# Patient Record
Sex: Female | Born: 1990 | Race: White | Hispanic: No | Marital: Married | Smoking: Never smoker
Health system: Southern US, Community
[De-identification: ages and names within clinical notes are randomized; demographics above are authoritative.]

## PROBLEM LIST (undated history)

## (undated) DIAGNOSIS — D649 Anemia, unspecified: Secondary | ICD-10-CM

## (undated) DIAGNOSIS — F32A Depression, unspecified: Secondary | ICD-10-CM

## (undated) DIAGNOSIS — E039 Hypothyroidism, unspecified: Secondary | ICD-10-CM

## (undated) DIAGNOSIS — K219 Gastro-esophageal reflux disease without esophagitis: Secondary | ICD-10-CM

## (undated) DIAGNOSIS — Q2381 Bicuspid aortic valve: Secondary | ICD-10-CM

## (undated) DIAGNOSIS — G43909 Migraine, unspecified, not intractable, without status migrainosus: Secondary | ICD-10-CM

## (undated) DIAGNOSIS — F988 Other specified behavioral and emotional disorders with onset usually occurring in childhood and adolescence: Secondary | ICD-10-CM

## (undated) DIAGNOSIS — Q231 Congenital insufficiency of aortic valve: Secondary | ICD-10-CM

## (undated) DIAGNOSIS — M7541 Impingement syndrome of right shoulder: Secondary | ICD-10-CM

## (undated) DIAGNOSIS — J45909 Unspecified asthma, uncomplicated: Secondary | ICD-10-CM

## (undated) HISTORY — DX: Gastro-esophageal reflux disease without esophagitis: K21.9

## (undated) HISTORY — PX: WISDOM TOOTH EXTRACTION: SHX21

---

## 2015-01-01 ENCOUNTER — Emergency Department: Payer: Self-pay | Admitting: Student

## 2017-08-18 ENCOUNTER — Ambulatory Visit
Admission: EM | Admit: 2017-08-18 | Discharge: 2017-08-18 | Disposition: A | Discharge status: Home / Self Care | Payer: PRIVATE HEALTH INSURANCE | Attending: Emergency Medicine | Admitting: Emergency Medicine

## 2017-08-18 ENCOUNTER — Ambulatory Visit (INDEPENDENT_AMBULATORY_CARE_PROVIDER_SITE_OTHER): Payer: PRIVATE HEALTH INSURANCE

## 2017-08-18 DIAGNOSIS — S20211A Contusion of right front wall of thorax, initial encounter: Secondary | ICD-10-CM | POA: Diagnosis not present

## 2017-08-18 DIAGNOSIS — W19XXXA Unspecified fall, initial encounter: Secondary | ICD-10-CM | POA: Diagnosis not present

## 2017-08-18 DIAGNOSIS — W182XXA Fall in (into) shower or empty bathtub, initial encounter: Secondary | ICD-10-CM

## 2017-08-18 HISTORY — DX: Impingement syndrome of right shoulder: M75.41

## 2017-08-18 HISTORY — DX: Other specified behavioral and emotional disorders with onset usually occurring in childhood and adolescence: F98.8

## 2017-08-18 MED ORDER — TRAMADOL HCL 50 MG PO TABS: 50.0000 mg | ORAL_TABLET | Freq: Four times a day (QID) | ORAL | 0 refills | Status: DC | PRN

## 2017-08-18 NOTE — ED Provider Notes (Signed)
HPI  SUBJECTIVE:  Robin Skinner is a 26 y.o. female who presents with sore, constant posterior right rib pain that becomes stabbing with torso rotation, inspiration, laughing after having a slip and fall on some soap while in the shower 1 hour prior to arrival. States that she slipped and fell hitting her right posterior torso on the bathtub corner. States that her spine is okay. She did not feel a pop or crack. She denies head injury, loss of consciousness or syncope. She reports questionable bruising in the area. No shortness of breath, hemoptysis, chest pain, shortness of breath, wheezing. No previous history of rib fractures. She has not tried anything for this. Symptoms are better with sitting still, worse with movement, torso rotation, laughing, inspiration and palpation in the area. She has a past medical history of right shoulder impingement syndrome for which she takes diclofenac on a regular basis. No history of diabetes, osteoporosis, hypertension, asthma, emphysema, COPD, pneumothorax. She is not a smoker. LMP: 9/7. Denies possibility of being pregnant states that we do not need to check. PMD: [facility] in [place].    Past Medical History:  Diagnosis Date  . ADD (attention deficit disorder)   . Shoulder impingement syndrome, right     Past Surgical History:  Procedure Laterality Date  . WISDOM TOOTH EXTRACTION      History reviewed. No pertinent family history.  Social History  Substance Use Topics  . Smoking status: Never Smoker  . Smokeless tobacco: Never Used  . Alcohol use Yes     Comment: occasionally    No current facility-administered medications for this encounter.   Current Outpatient Prescriptions:  .  diclofenac (VOLTAREN) 50 MG EC tablet, Take 50 mg by mouth 2 (two) times daily., Disp: , Rfl:  .  omeprazole (PRILOSEC) 40 MG capsule, Take 40 mg by mouth daily., Disp: , Rfl:  .  ranitidine (ZANTAC) 150 MG tablet, Take 150 mg by mouth 2 (two) times daily., Disp: ,  Rfl:  .  traMADol (ULTRAM) 50 MG tablet, Take 1 tablet (50 mg total) by mouth every 6 (six) hours as needed., Disp: 20 tablet, Rfl: 0  No Known Allergies   ROS  As noted in HPI.   Physical Exam  BP 115/71 (BP Location: Left Arm)   Pulse 84   Temp 98.6 F (37 C) (Oral)   Resp 18   Ht 5\' 9"  (1.753 m)   Wt 180 lb (81.6 kg)   LMP 08/06/2017   SpO2 100%   BMI 26.58 kg/m   Constitutional: Well developed, well nourished, no acute distress Eyes:  EOMI, conjunctiva normal bilaterally HENT: Normocephalic, atraumatic,mucus membranes moist Respiratory: Slightly limited inspiratory effort due to pain. Lungs clear bilaterally. Cardiovascular: Normal rate GI: nondistended skin: No rash, skin intact Musculoskeletal: No C-spine T-spine L-spine tenderness. Positive contusion posterior right ribs in the lower thoracic region. Positive point rib tenderness along ribs 10, 11 posteriorly. no other rib or bony tenderness in the midaxillary line or anteriorly.  Neurologic: Alert & oriented x 3, no focal neuro deficits Psychiatric: Speech and behavior appropriate   ED Course   Medications - No data to display  Orders Placed This Encounter  Procedures  . DG Ribs Unilateral W/Chest Right    Standing Status:   Standing    Number of Occurrences:   1    Order Specific Question:   Reason for Exam (SYMPTOM  OR DIAGNOSIS REQUIRED)    Answer:   fall post rib lower tenderness r/o fx  ptx    No results found for this or any previous visit (from the past 24 hour(s)). Dg Ribs Unilateral W/chest Right  Result Date: 08/18/2017 CLINICAL DATA:  26 year old female with a history of right posterior rib pain after a fall EXAM: RIGHT RIBS AND CHEST - 3+ VIEW COMPARISON:  None. FINDINGS: No fracture or other bone lesions are seen involving the ribs. There is no evidence of pneumothorax or pleural effusion. Both lungs are clear. Heart size and mediastinal contours are within normal limits. IMPRESSION: Negative  for acute displaced rib fracture. Electronically Signed   By: Gilmer Mor D.O.   On: 08/18/2017 14:40    ED Clinical Impression  Fall, initial encounter  Contusion of rib on right side, initial encounter   ED Assessment/Plan  [place] Narcotic database reviewed for this patient, and feel that the risk/benefit ratio today is favorable for proceeding with a prescription for controlled substance. No opiate prescriptions in the past 6 months.  Patient declined pain medicine while here.  Checking rib series rule out fracture, pneumothorax. Plan to send home with continued regular diclofenac, patient states she does not need a prescription for this, she is to add 1 g of Tylenol to this. May take an additional 1 g of Tylenol twice a day for total of 4 g per day. We'll send home with tramadol for severe pain. Advised incentive spirometer. Follow up with PMD as needed. To the ER if she gets worse.  Reviewed  imaging independently. no displaced fracture, pleural effusion, or pneumothorax See radiology report for full details.  plan as above.  Discussed imaging, MDM, plan and followup with patient. Discussed sn/sx that should prompt return to the ED. Patient agrees with plan.   Meds ordered this encounter  Medications  . diclofenac (VOLTAREN) 50 MG EC tablet    Sig: Take 50 mg by mouth 2 (two) times daily.  . ranitidine (ZANTAC) 150 MG tablet    Sig: Take 150 mg by mouth 2 (two) times daily.  Marland Kitchen omeprazole (PRILOSEC) 40 MG capsule    Sig: Take 40 mg by mouth daily.  . traMADol (ULTRAM) 50 MG tablet    Sig: Take 1 tablet (50 mg total) by mouth every 6 (six) hours as needed.    Dispense:  20 tablet    Refill:  0    *This clinic note was created using Scientist, clinical (histocompatibility and immunogenetics). Therefore, there may be occasional mistakes despite careful proofreading.  ?   Domenick Gong, MD 08/18/17 1535

## 2017-08-18 NOTE — Discharge Instructions (Signed)
Continue diclofenac with 1 g of Tylenol twice a day. You may take an additional 1 g of Tylenol twice a day for total of 4 g per day. tramadol for severe pain. You may also try an incentive spirometer. Follow up with your primary care physician as needed. To the ER if you get worse.

## 2017-08-18 NOTE — ED Triage Notes (Signed)
Patient complains of right sided back and rib pain that occurred after a fall in the shower around 1 hour ago. Patient states that she has pain is worse with deep breaths, laughing and moving.

## 2018-01-13 ENCOUNTER — Ambulatory Visit
Admission: EM | Admit: 2018-01-13 | Discharge: 2018-01-13 | Disposition: A | Discharge status: Home / Self Care | Payer: 59 | Attending: Family Medicine | Admitting: Family Medicine

## 2018-01-13 ENCOUNTER — Other Ambulatory Visit: Payer: Self-pay

## 2018-01-13 DIAGNOSIS — H6123 Impacted cerumen, bilateral: Secondary | ICD-10-CM | POA: Diagnosis not present

## 2018-01-13 DIAGNOSIS — R112 Nausea with vomiting, unspecified: Secondary | ICD-10-CM | POA: Diagnosis not present

## 2018-01-13 DIAGNOSIS — R42 Dizziness and giddiness: Secondary | ICD-10-CM

## 2018-01-13 DIAGNOSIS — H8149 Vertigo of central origin, unspecified ear: Secondary | ICD-10-CM

## 2018-01-13 MED ORDER — MECLIZINE HCL 25 MG PO TABS
25.0000 mg | ORAL_TABLET | Freq: Once | ORAL | Status: AC
  Administered 2018-01-13: 25 mg via ORAL

## 2018-01-13 MED ORDER — ONDANSETRON 8 MG PO TBDP
8.0000 mg | ORAL_TABLET | Freq: Once | ORAL | Status: AC
  Administered 2018-01-13: 8 mg via ORAL

## 2018-01-13 MED ORDER — ONDANSETRON 4 MG PO TBDP: 4.0000 mg | ORAL_TABLET | Freq: Three times a day (TID) | ORAL | 0 refills | Status: DC | PRN

## 2018-01-13 MED ORDER — MECLIZINE HCL 25 MG PO TABS: 25.0000 mg | ORAL_TABLET | Freq: Three times a day (TID) | ORAL | 0 refills | Status: DC | PRN

## 2018-01-13 NOTE — ED Provider Notes (Signed)
[facility] ____________________________________________  Time seen: Approximately 1:57 PM  I have reviewed the triage vital signs and the nursing notes.   HISTORY  Chief Complaint Dizziness   HPI Robin Skinner is a 27 y.o. female presenting with significant other at bedside for evaluation of room spinning sensation, nausea and vomiting that started after awakening this morning.  Patient reports that soon as she woke up she felt the complaints.  Patient reports room appears to be spinning is worse with movement of head, and states that sitting still symptoms improve.  Patient reports continued nausea with 2 episodes of vomiting this morning.  Denies abdominal pain or diarrhea.  Denies associated fever, cough, congestion or known triggers.  Reports otherwise feels well.  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  Patient denies any paresthesias, unilateral weakness, confusion, headache, vision changes, weakness, syncope.  States felt fine last night.  Denies fall, head injury or head trauma.  No dysphasia or dysarthria.   Denies chest pain, shortness of breath, abdominal pain, dysuria, extremity pain, extremity swelling or rash. Denies recent sickness. Denies recent antibiotic use.   Patient's last menstrual period was 12/30/2017.Denies pregnancy.   Past Medical History:  Diagnosis Date  . ADD (attention deficit disorder)   . Shoulder impingement syndrome, right     There are no active problems to display for this patient.   Past Surgical History:  Procedure Laterality Date  . WISDOM TOOTH EXTRACTION       No current facility-administered medications for this encounter.   Current Outpatient Medications:  .  omeprazole (PRILOSEC) 40 MG capsule, Take 40 mg by mouth daily., Disp: , Rfl:  .  ranitidine (ZANTAC) 150  MG tablet, Take 150 mg by mouth 2 (two) times daily., Disp: , Rfl:  .  meclizine (ANTIVERT) 25 MG tablet, Take 1 tablet (25 mg total) by mouth 3 (three) times daily as needed for dizziness., Disp: 15 tablet, Rfl: 0 .  ondansetron (ZOFRAN ODT) 4 MG disintegrating tablet, Take 1 tablet (4 mg total) by mouth every 8 (eight) hours as needed., Disp: 15 tablet, Rfl: 0  Allergies Patient has no known allergies.   family history  Thrombocytopenia; father and sister   Social History Social History   Tobacco Use  . Smoking status: Never Smoker  . Smokeless tobacco: Never Used  Substance Use Topics  . Alcohol use: Yes    Comment: occasionally  . Drug use: No    Review of Systems Constitutional: No fever/chills Eyes: No visual changes. As above.  ENT: No sore throat. Cardiovascular: Denies chest pain. Respiratory: Denies shortness of breath. Gastrointestinal: No abdominal pain.  As above.  Genitourinary: Negative for dysuria. Musculoskeletal: Negative for back pain. Skin: Negative for rash. Neurological: Negative for focal weakness or numbness.  ____________________________________________   PHYSICAL EXAM:  VITAL SIGNS: ED Triage Vitals  Enc Vitals Group     BP 01/13/18 1301 117/69     Pulse Rate 01/13/18 1301 77     Resp 01/13/18 1301 18     Temp 01/13/18 1301 98.5 F (36.9 C)     Temp Source 01/13/18 1301 Oral     SpO2 01/13/18 1301 100 %     Weight 01/13/18 1259 218 lb (98.9 kg)     Height 01/13/18 1259 5\' 9"  (1.753 m)     Head Circumference --  Peak Flow --      Pain Score 01/13/18 1259 0     Pain Loc --      Pain Edu? --      Excl. in GC? --     Constitutional: Alert and oriented. Well appearing and in no acute distress. Eyes: Conjunctivae are normal. PERRL. EOMI. No pain with EOMs.  Positive horizontal nystagmus with left head impulse test.  Head: Atraumatic. No tenderness over temporal arteries. No sinus TTP. No tenderness to palpation.   Ears: Bilateral  cerumen impaction.  Post irrigation removal by RN, bilateral nontender, normal canal, no erythema normal TMs.  No mastoid tenderness bilaterally.  Nose: No congestion/rhinnorhea.  Mouth/Throat: Mucous membranes are moist.  Oropharynx non-erythematous.  No tonsillar swelling or exudate. Neck: No stridor.  No cervical spine tenderness to palpation. No carotid bruits.  Hematological/Lymphatic/Immunilogical: No cervical lymphadenopathy. Cardiovascular: Normal rate, regular rhythm. Grossly normal heart sounds.  Good peripheral circulation. Respiratory: Normal respiratory effort.  No retractions. No wheezes, rales or rhonchi.  Gastrointestinal: Soft and nontender.  Musculoskeletal:  No cervical, thoracic or lumbar tenderness to palpation.  Neurologic:  Normal speech and language. No gross focal neurologic deficits are appreciated. No gait instability. No ataxia noted. Negative Romberg. No meningismus. No paresthesias. 5/5 strength to bilateral upper and lower extremities.  Skin:  Skin is warm, dry and intact. No rash noted. Psychiatric: Mood and affect are normal. Speech and behavior are normal.  ___________________________________________   LABS (all labs ordered are listed, but only abnormal results are displayed)  Labs Reviewed - No data to display ____________________________________________  RADIOLOGY  No results found. ____________________________________________   PROCEDURES Procedures   Cerumen impaction bilateral noted..  Wax is removed by irrigation and manual debridement by RN. Instructions for home care to prevent wax buildup are given.   INITIAL IMPRESSION / ASSESSMENT AND PLAN / ED COURSE  Pertinent labs & imaging results that were available during my care of the patient were reviewed by me and considered in my medical decision making (see chart for details).  Overall well-appearing patient.  Patient with history of vestibular neuritis with similar presentation.  Exam  consistent with peripheral vertigo cause, no focal neurological deficits.  Meclizine and Zofran given in urgent care.  Patient reports improvement of nausea but no improvement of vertigo sensation.  Will Rx Zofran and meclizine.  Discussed no clear indication for laboratory studies at this time.  Counseled for no improvement or worsening concerns proceeding directly to the emergency room.  Patient agrees with this plan.  Order given for today and tomorrow. Discussed indication, risks and benefits of medications with patient.  Discussed follow up with Primary care physician this week. Discussed follow up and return parameters including no resolution or any worsening concerns. Patient verbalized understanding and agreed to plan.   ____________________________________________   FINAL CLINICAL IMPRESSION(S) / ED DIAGNOSES  Final diagnoses:  Vertigo  Bilateral impacted cerumen  Dizziness  Non-intractable vomiting with nausea, unspecified vomiting type     ED Discharge Orders        Ordered    meclizine (ANTIVERT) 25 MG tablet  3 times daily PRN     01/13/18 1511    ondansetron (ZOFRAN ODT) 4 MG disintegrating tablet  Every 8 hours PRN     01/13/18 1511       Note: This dictation was prepared with Dragon dictation along with smaller phrase technology. Any transcriptional errors that result from this process are unintentional.  Renford Dills, NP 01/13/18 1529

## 2018-01-13 NOTE — Discharge Instructions (Signed)
Take medication as prescribed. Rest. Drink plenty of fluids.   Follow up with your primary care physician this week as needed. Return to Urgent care as needed.  Proceed directly to the emergency room for no improvement or worsening concerns.

## 2018-01-13 NOTE — ED Triage Notes (Signed)
Patient complains of vertigo, nausea and vomiting that started upon wakening this morning. Patient states that she has had this once before and they called it vestibular neuritis.

## 2018-01-14 ENCOUNTER — Other Ambulatory Visit: Payer: Self-pay

## 2018-01-16 ENCOUNTER — Telehealth: Payer: Self-pay | Admitting: Emergency Medicine

## 2018-01-16 NOTE — Telephone Encounter (Signed)
Called to follow up with patient after her recent visit. Left message for patient to call with any questions or concerns. 

## 2018-01-21 ENCOUNTER — Ambulatory Visit (INDEPENDENT_AMBULATORY_CARE_PROVIDER_SITE_OTHER): Payer: 59 | Admitting: Family Medicine

## 2018-01-21 ENCOUNTER — Encounter: Payer: Self-pay | Admitting: Family Medicine

## 2018-01-21 VITALS — BP 120/78 | HR 72 | Ht 69.0 in | Wt 225.0 lb

## 2018-01-21 DIAGNOSIS — R112 Nausea with vomiting, unspecified: Secondary | ICD-10-CM | POA: Diagnosis not present

## 2018-01-21 DIAGNOSIS — H8309 Labyrinthitis, unspecified ear: Secondary | ICD-10-CM | POA: Diagnosis not present

## 2018-01-21 DIAGNOSIS — Z7689 Persons encountering health services in other specified circumstances: Secondary | ICD-10-CM

## 2018-01-21 MED ORDER — ONDANSETRON HCL 4 MG PO TABS: 4.0000 mg | ORAL_TABLET | Freq: Three times a day (TID) | ORAL | 0 refills | Status: DC | PRN

## 2018-01-21 NOTE — Patient Instructions (Signed)
Labyrinthitis Labyrinthitis is an infection of the inner ear. Your inner ear is a fluid-filled system of tubes and canals (labyrinth). Nerve cells in your inner ear send signals for hearing and balance to your brain. When tiny germs (microorganisms) get inside the labyrinth, they harm the cells that send messages to the brain. Labyrinthitis can cause changes in hearing and balance. Most cases of labyrinthitis come on suddenly and they clear up within weeks. If the infection damages parts of the labyrinth, some symptoms may remain (chronic labyrinthitis). What are the causes? Viruses are the most common cause of labyrinthitis. Viruses that spread into the labyrinth are the same viruses that cause other diseases, such as:  Mononucleosis.  Measles.  Flu.  Herpes.  Bacteria can also cause labyrinthitis when they spread into the labyrinth from an infection in the brain or the middle ear. Bacteria can cause:  Serous labyrinthitis. This type of labyrinthitis develops when bacteria produce a poison (toxin) that gets inside the labyrinth.  Suppurative labyrinthitis. This type of labyrinthitis develops when bacteria get inside the labyrinth.  What increases the risk? You may be at greater risk for labyrinthitis if you:  Drink a lot of alcohol.  Smoke.  Take certain drugs.  Are not well rested (fatigued).  Are under a lot of stress.  Have allergies.  Recently had a nose or throat infection (upper respiratory infection) or an ear infection.  What are the signs or symptoms? Symptoms of labyrinthitis usually start suddenly. The symptoms can be mild or strong and may include:  Dizziness.  Hearing loss.  A feeling that you are moving when you are not (vertigo).  Ringing in the ear (tinnitus).  Nausea and vomiting.  Trouble focusing your eyes.  Symptoms of chronic labyrinthitis may include:  Fatigue.  Confusion.  Hearing loss.  Tinnitus.  Poor balance.  Vertigo after  sudden head movements.  How is this diagnosed? Your health care provider may suspect labyrinthitis if you suddenly get dizzy and lose hearing, especially if you had a recent upper respiratory infection. Your health care provider will perform a physical exam to:  Check your ears for infection.  Test your balance.  Check your eye movement.  Your health care provider may do several tests to rule out other causes of your symptoms and to help make a diagnosis of labyrinthitis. These may include:  Imaging studies, such as a CT scan or an MRI, to look for other causes of your symptoms.  Hearing tests.  Electronystagmography (ENG) to check your balance.  How is this treated? Treatment of labyrinthitis depends on the cause. If your labyrinthitis is caused by a virus, it may get better without treatment. If your labyrinthitis is caused by bacteria, you may need medicine to fight the infection (antibiotic medicine). You may also have treatment to relieve labyrinthitis symptoms. Treatments may include:  Medicines to: ? Stop dizziness. ? Relieve nausea. ? Treat the inflamed area. ? Speed up your recovery.  Bed rest until dizziness goes away.  Fluids given through an IV tube. You may need this treatment if you have too little fluid in your body (dehydrated) from repeated nausea and vomiting.  Follow these instructions at home:  Take medicines only as directed by your health care provider.  If you were prescribed an antibiotic medicine, finish all of it even if you start to feel better.  Rest as much as possible.  Avoid loud noises and bright lights.  Do not make sudden movements until any dizziness goes   away.  Do not drive until your health care provider says that you can.  Drink enough fluid to keep your urine clear or pale yellow.  Work with a physical therapist if you still feel dizzy after several weeks. A therapist can teach you exercises to help you adjust to feeling dizzy  (vestibular rehabilitation exercises).  Keep all follow-up visits as directed by your health care provider. This is important. Contact a health care provider if:  Your symptoms are not relieved by medicines.  Your symptoms last longer than two weeks.  You have a fever. Get help right away if:  You become very dizzy.  You have nausea or vomiting that does not go away.  Your hearing gets much worse very quickly. This information is not intended to replace advice given to you by your health care provider. Make sure you discuss any questions you have with your health care provider. Document Released: 01/07/2015 Document Revised: 05/24/2016 Document Reviewed: 08/18/2014 [employer] Interactive Patient Education  2018 [employer]  

## 2018-01-21 NOTE — Progress Notes (Signed)
Name: Robin Skinner   MRN: [id number]    DOB: 1991-03-05   Date:01/21/2018       Progress Note  Subjective  Chief Complaint  Chief Complaint  Patient presents with  . Establish Care  . Dizziness    wants referral to Dr Elenore Rota for recurrent vertigo    Patient presents for establishment of primary care.   Dizziness  This is a recurrent (vertigo) problem. The current episode started more than 1 month ago. The problem occurs intermittently. The problem has been waxing and waning. Associated symptoms include congestion, nausea, vertigo, a visual change and vomiting. Pertinent negatives include no abdominal pain, chest pain, chills, coughing, fever, headaches, myalgias, neck pain, numbness, rash, sore throat or weakness. Exacerbated by: turning head/ rolling ovver in bed. She has tried drinking and immobilization (water/meclizine/eply manuevers) for the symptoms. The treatment provided no relief.    No problem-specific Assessment & Plan notes found for this encounter.   Past Medical History:  Diagnosis Date  . ADD (attention deficit disorder)   . GERD (gastroesophageal reflux disease)   . Shoulder impingement syndrome, right     Past Surgical History:  Procedure Laterality Date  . WISDOM TOOTH EXTRACTION      Family History  Problem Relation Age of Onset  . Heart disease Maternal Grandmother   . Hypertension Maternal Grandmother   . Stroke Maternal Grandmother   . Diabetes Paternal Grandmother     Social History   Socioeconomic History  . Marital status: Single    Spouse name: Not on file  . Number of children: Not on file  . Years of education: Not on file  . Highest education level: Not on file  Social Needs  . Financial resource strain: Not on file  . Food insecurity - worry: Not on file  . Food insecurity - inability: Not on file  . Transportation needs - medical: Not on file  . Transportation needs - non-medical: Not on file  Occupational History  . Not on file   Tobacco Use  . Smoking status: Never Smoker  . Smokeless tobacco: Never Used  Substance and Sexual Activity  . Alcohol use: Yes    Comment: occasionally  . Drug use: No  . Sexual activity: Not on file  Other Topics Concern  . Not on file  Social History Narrative  . Not on file    No Known Allergies  Outpatient Medications Prior to Visit  Medication Sig Dispense Refill  . acetaminophen (TYLENOL) 500 MG tablet Take 500 mg by mouth 2 (two) times daily. otc    . etonogestrel (IMPLANON) 68 MG IMPL implant Inject into the skin.    Marland Kitchen ibuprofen (ADVIL,MOTRIN) 200 MG tablet Take 800 mg by mouth 2 (two) times daily. otc    . meclizine (ANTIVERT) 25 MG tablet Take 1 tablet (25 mg total) by mouth 3 (three) times daily as needed for dizziness. 15 tablet 0  . omeprazole (PRILOSEC) 40 MG capsule Take 40 mg by mouth daily.    . ondansetron (ZOFRAN ODT) 4 MG disintegrating tablet Take 1 tablet (4 mg total) by mouth every 8 (eight) hours as needed. 15 tablet 0  . ranitidine (ZANTAC) 150 MG tablet Take 150 mg by mouth 2 (two) times daily.     No facility-administered medications prior to visit.     Review of Systems  Constitutional: Negative for chills, fever, malaise/fatigue and weight loss.  HENT: Positive for congestion. Negative for ear discharge, ear pain, hearing loss, nosebleeds, sore  throat and tinnitus.   Eyes: Negative for blurred vision.  Respiratory: Negative for cough, sputum production, shortness of breath and wheezing.   Cardiovascular: Negative for chest pain, palpitations and leg swelling.  Gastrointestinal: Positive for nausea and vomiting. Negative for abdominal pain, blood in stool, constipation, diarrhea, heartburn and melena.  Genitourinary: Negative for dysuria, frequency, hematuria and urgency.  Musculoskeletal: Negative for back pain, joint pain, myalgias and neck pain.  Skin: Negative for rash.  Neurological: Positive for dizziness and vertigo. Negative for tingling,  sensory change, focal weakness, weakness, numbness and headaches.  Endo/Heme/Allergies: Negative for environmental allergies and polydipsia. Does not bruise/bleed easily.  Psychiatric/Behavioral: Negative for depression and suicidal ideas. The patient is not nervous/anxious and does not have insomnia.      Objective  Vitals:   01/21/18 1538  BP: 120/78  Pulse: 72  Weight: 225 lb (102.1 kg)  Height: 5\' 9"  (1.753 m)    Physical Exam  Constitutional: She is well-developed, well-nourished, and in no distress. No distress.  HENT:  Head: Normocephalic and atraumatic.  Right Ear: External ear normal.  Left Ear: External ear normal.  Nose: Nose normal.  Mouth/Throat: Oropharynx is clear and moist.  Eyes: Conjunctivae and EOM are normal. Pupils are equal, round, and reactive to light. Right eye exhibits no discharge. Left eye exhibits no discharge.  Neck: Normal range of motion. Neck supple. No JVD present. No thyromegaly present.  Cardiovascular: Normal rate, regular rhythm, normal heart sounds and intact distal pulses. Exam reveals no gallop and no friction rub.  No murmur heard. Pulmonary/Chest: Effort normal and breath sounds normal. She has no wheezes. She has no rales.  Abdominal: Soft. Bowel sounds are normal. She exhibits no mass. There is no tenderness. There is no guarding.  Musculoskeletal: Normal range of motion. She exhibits no edema.  Lymphadenopathy:    She has no cervical adenopathy.  Neurological: She is alert. She has normal motor skills, normal sensation, normal strength, normal reflexes and intact cranial nerves. No cranial nerve deficit.  Weber/Rene normal  Skin: Skin is warm and dry. She is not diaphoretic.  Psychiatric: Mood and affect normal.  Nursing note and vitals reviewed.     Assessment & Plan  Problem List Items Addressed This Visit    None    Visit Diagnoses    Establishing care with new doctor, encounter for    -  Primary   Labyrinthitis,  unspecified laterality       Relevant Medications   ondansetron (ZOFRAN) 4 MG tablet   Other Relevant Orders   Ambulatory referral to ENT   Non-intractable vomiting with nausea, unspecified vomiting type       Relevant Medications   ondansetron (ZOFRAN) 4 MG tablet      Meds ordered this encounter  Medications  . ondansetron (ZOFRAN) 4 MG tablet    Sig: Take 1 tablet (4 mg total) by mouth every 8 (eight) hours as needed for nausea or vomiting.    Dispense:  20 tablet    Refill:  0      Dr. Elizabeth Sauereanna [facility]is [facility]  01/21/18

## 2018-02-20 DIAGNOSIS — R42 Dizziness and giddiness: Secondary | ICD-10-CM | POA: Diagnosis not present

## 2018-02-21 DIAGNOSIS — M7581 Other shoulder lesions, right shoulder: Secondary | ICD-10-CM | POA: Diagnosis not present

## 2018-02-25 DIAGNOSIS — M7581 Other shoulder lesions, right shoulder: Secondary | ICD-10-CM | POA: Diagnosis not present

## 2018-02-28 DIAGNOSIS — M7581 Other shoulder lesions, right shoulder: Secondary | ICD-10-CM | POA: Diagnosis not present

## 2018-03-03 DIAGNOSIS — M7581 Other shoulder lesions, right shoulder: Secondary | ICD-10-CM | POA: Diagnosis not present

## 2018-03-06 DIAGNOSIS — M7581 Other shoulder lesions, right shoulder: Secondary | ICD-10-CM | POA: Diagnosis not present

## 2018-03-07 DIAGNOSIS — R42 Dizziness and giddiness: Secondary | ICD-10-CM | POA: Diagnosis not present

## 2018-03-10 DIAGNOSIS — M7581 Other shoulder lesions, right shoulder: Secondary | ICD-10-CM | POA: Diagnosis not present

## 2018-03-13 DIAGNOSIS — M7581 Other shoulder lesions, right shoulder: Secondary | ICD-10-CM | POA: Diagnosis not present

## 2018-03-17 DIAGNOSIS — M7581 Other shoulder lesions, right shoulder: Secondary | ICD-10-CM | POA: Diagnosis not present

## 2018-03-20 DIAGNOSIS — M7581 Other shoulder lesions, right shoulder: Secondary | ICD-10-CM | POA: Diagnosis not present

## 2018-03-24 DIAGNOSIS — M7581 Other shoulder lesions, right shoulder: Secondary | ICD-10-CM | POA: Diagnosis not present

## 2018-03-27 DIAGNOSIS — M7581 Other shoulder lesions, right shoulder: Secondary | ICD-10-CM | POA: Diagnosis not present

## 2018-03-31 DIAGNOSIS — M7581 Other shoulder lesions, right shoulder: Secondary | ICD-10-CM | POA: Diagnosis not present

## 2018-04-07 DIAGNOSIS — M7581 Other shoulder lesions, right shoulder: Secondary | ICD-10-CM | POA: Diagnosis not present

## 2018-04-08 ENCOUNTER — Ambulatory Visit: Payer: 59 | Admitting: Family Medicine

## 2018-04-10 ENCOUNTER — Encounter: Payer: Self-pay | Admitting: Family Medicine

## 2018-04-10 ENCOUNTER — Ambulatory Visit (INDEPENDENT_AMBULATORY_CARE_PROVIDER_SITE_OTHER): Payer: 59 | Admitting: Family Medicine

## 2018-04-10 VITALS — BP 120/70 | HR 68 | Ht 69.0 in | Wt 221.0 lb

## 2018-04-10 DIAGNOSIS — G4730 Sleep apnea, unspecified: Secondary | ICD-10-CM | POA: Diagnosis not present

## 2018-04-10 DIAGNOSIS — F902 Attention-deficit hyperactivity disorder, combined type: Secondary | ICD-10-CM

## 2018-04-10 NOTE — Patient Instructions (Signed)

## 2018-04-10 NOTE — Progress Notes (Signed)
Name: Robin Skinner   MRN: [id number]    DOB: 03/09/1991   Date:04/10/2018       Progress Note  Subjective  Chief Complaint  Chief Complaint  Patient presents with  . Snoring    after sleeping for 6 hours or so, still tired. hard to focus. Legs cramping    Patient desires evaluation for sleep study for sleep apnea. [facility] discussed.   Patient would also like to explore ADHD concerns. Patient snores/ loud/ nightly.     No problem-specific Assessment & Plan notes found for this encounter.   Past Medical History:  Diagnosis Date  . ADD (attention deficit disorder)   . GERD (gastroesophageal reflux disease)   . Shoulder impingement syndrome, right     Past Surgical History:  Procedure Laterality Date  . WISDOM TOOTH EXTRACTION      Family History  Problem Relation Age of Onset  . Heart disease Maternal Grandmother   . Hypertension Maternal Grandmother   . Stroke Maternal Grandmother   . Diabetes Paternal Grandmother     Social History   Socioeconomic History  . Marital status: Single    Spouse name: Not on file  . Number of children: Not on file  . Years of education: Not on file  . Highest education level: Not on file  Occupational History  . Not on file  Social Needs  . Financial resource strain: Not on file  . Food insecurity:    Worry: Not on file    Inability: Not on file  . Transportation needs:    Medical: Not on file    Non-medical: Not on file  Tobacco Use  . Smoking status: Never Smoker  . Smokeless tobacco: Never Used  Substance and Sexual Activity  . Alcohol use: Yes    Comment: occasionally  . Drug use: No  . Sexual activity: Not on file  Lifestyle  . Physical activity:    Days per week: Not on file    Minutes per session: Not on file  . Stress: Not on file  Relationships  . Social connections:    Talks on phone: Not on file    Gets together: Not on file    Attends religious service: Not on file    Active member of club or organization:  Not on file    Attends meetings of clubs or organizations: Not on file    Relationship status: Not on file  . Intimate partner violence:    Fear of current or ex partner: Not on file    Emotionally abused: Not on file    Physically abused: Not on file    Forced sexual activity: Not on file  Other Topics Concern  . Not on file  Social History Narrative  . Not on file    No Known Allergies  Outpatient Medications Prior to Visit  Medication Sig Dispense Refill  . acetaminophen (TYLENOL) 500 MG tablet Take 500 mg by mouth 2 (two) times daily. otc    . etonogestrel (IMPLANON) 68 MG IMPL implant Inject into the skin.    Marland Kitchen. ibuprofen (ADVIL,MOTRIN) 200 MG tablet Take 800 mg by mouth 2 (two) times daily. otc    . meclizine (ANTIVERT) 25 MG tablet Take 1 tablet (25 mg total) by mouth 3 (three) times daily as needed for dizziness. 15 tablet 0  . omeprazole (PRILOSEC) 40 MG capsule Take 40 mg by mouth daily.    . ondansetron (ZOFRAN ODT) 4 MG disintegrating tablet Take 1 tablet (4  mg total) by mouth every 8 (eight) hours as needed. 15 tablet 0  . ranitidine (ZANTAC) 150 MG tablet Take 150 mg by mouth 2 (two) times daily. As needed    . ondansetron (ZOFRAN) 4 MG tablet Take 1 tablet (4 mg total) by mouth every 8 (eight) hours as needed for nausea or vomiting. 20 tablet 0   No facility-administered medications prior to visit.     Review of Systems  Constitutional: Negative for chills, fever, malaise/fatigue and weight loss.  HENT: Negative for ear discharge, ear pain and sore throat.   Eyes: Negative for blurred vision.  Respiratory: Negative for cough, sputum production, shortness of breath and wheezing.   Cardiovascular: Negative for chest pain, palpitations and leg swelling.  Gastrointestinal: Negative for abdominal pain, blood in stool, constipation, diarrhea, heartburn, melena and nausea.  Genitourinary: Negative for dysuria, frequency, hematuria and urgency.  Musculoskeletal: Negative  for back pain, joint pain, myalgias and neck pain.  Skin: Negative for rash.  Neurological: Negative for dizziness, tingling, sensory change, focal weakness and headaches.  Endo/Heme/Allergies: Negative for environmental allergies and polydipsia. Does not bruise/bleed easily.  Psychiatric/Behavioral: Negative for depression and suicidal ideas. The patient is not nervous/anxious and does not have insomnia.      Objective  Vitals:   04/10/18 1339  BP: 120/70  Pulse: 68  Weight: 221 lb (100.2 kg)  Height: 5\' 9"  (1.753 m)    Physical Exam  Constitutional: She is oriented to person, place, and time. She appears well-developed and well-nourished.  HENT:  Head: Normocephalic.  Right Ear: External ear normal.  Left Ear: External ear normal.  Mouth/Throat: Oropharynx is clear and moist.  Eyes: Pupils are equal, round, and reactive to light. Conjunctivae and EOM are normal. Lids are everted and swept, no foreign bodies found. Left eye exhibits no hordeolum. No foreign body present in the left eye. Right conjunctiva is not injected. Left conjunctiva is not injected. No scleral icterus.  Neck: Normal range of motion. Neck supple. No JVD present. No tracheal deviation present. No thyromegaly present.  Cardiovascular: Normal rate, regular rhythm, normal heart sounds and intact distal pulses. Exam reveals no gallop and no friction rub.  No murmur heard. Pulmonary/Chest: Effort normal and breath sounds normal. No respiratory distress. She has no wheezes. She has no rales.  Abdominal: Soft. Bowel sounds are normal. She exhibits no mass. There is no hepatosplenomegaly. There is no tenderness. There is no rebound and no guarding.  Musculoskeletal: Normal range of motion. She exhibits no edema or tenderness.  Lymphadenopathy:    She has no cervical adenopathy.  Neurological: She is alert and oriented to person, place, and time. She has normal strength. She displays normal reflexes. No cranial nerve  deficit.  Skin: Skin is warm. No rash noted.  Psychiatric: She has a normal mood and affect. Her mood appears not anxious. She does not exhibit a depressed mood.  Nursing note and vitals reviewed.     Assessment & Plan  Problem List Items Addressed This Visit    None    Visit Diagnoses    Attention deficit hyperactivity disorder (ADHD), combined type    -  Primary   suspect /needs evaluation/ as child on stattera   Relevant Orders   Ambulatory referral to Psychiatry   Sleep apnea, unspecified type       desires evaluation   Relevant Orders   Ambulatory referral to Sleep Studies      No orders of the defined types were placed  in this encounter.     Dr. [facility]  04/10/18

## 2018-04-14 DIAGNOSIS — M7581 Other shoulder lesions, right shoulder: Secondary | ICD-10-CM | POA: Diagnosis not present

## 2018-04-21 DIAGNOSIS — Z79899 Other long term (current) drug therapy: Secondary | ICD-10-CM | POA: Diagnosis not present

## 2018-04-21 DIAGNOSIS — M7581 Other shoulder lesions, right shoulder: Secondary | ICD-10-CM | POA: Diagnosis not present

## 2018-04-21 DIAGNOSIS — F900 Attention-deficit hyperactivity disorder, predominantly inattentive type: Secondary | ICD-10-CM | POA: Diagnosis not present

## 2018-04-21 DIAGNOSIS — F4011 Social phobia, generalized: Secondary | ICD-10-CM | POA: Diagnosis not present

## 2019-01-16 IMAGING — CR DG RIBS W/ CHEST 3+V*R*
5 series · 5 of 5 positions shown · non-contrast
Comparison: None.

CLINICAL DATA: 26-year-old female with a history of right posterior
rib pain after a fall

EXAM:
RIGHT RIBS AND CHEST - 3+ VIEW

[chest pa]
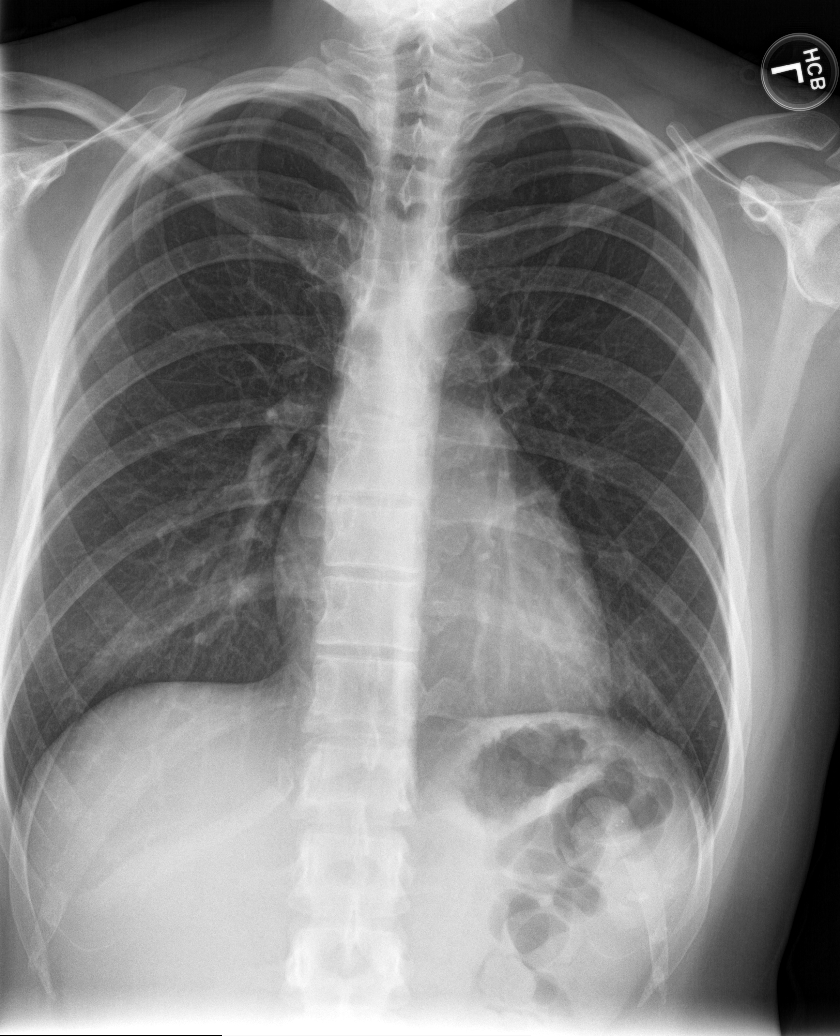

[rib pa (1 of 2)]
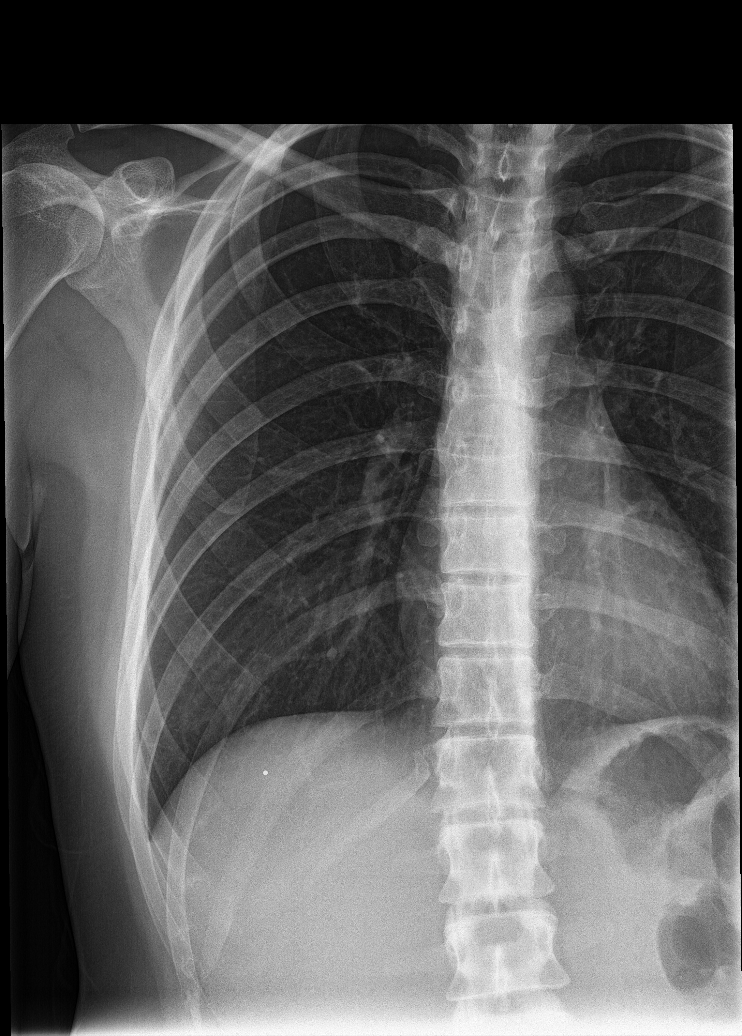

[rib pa (2 of 2)]
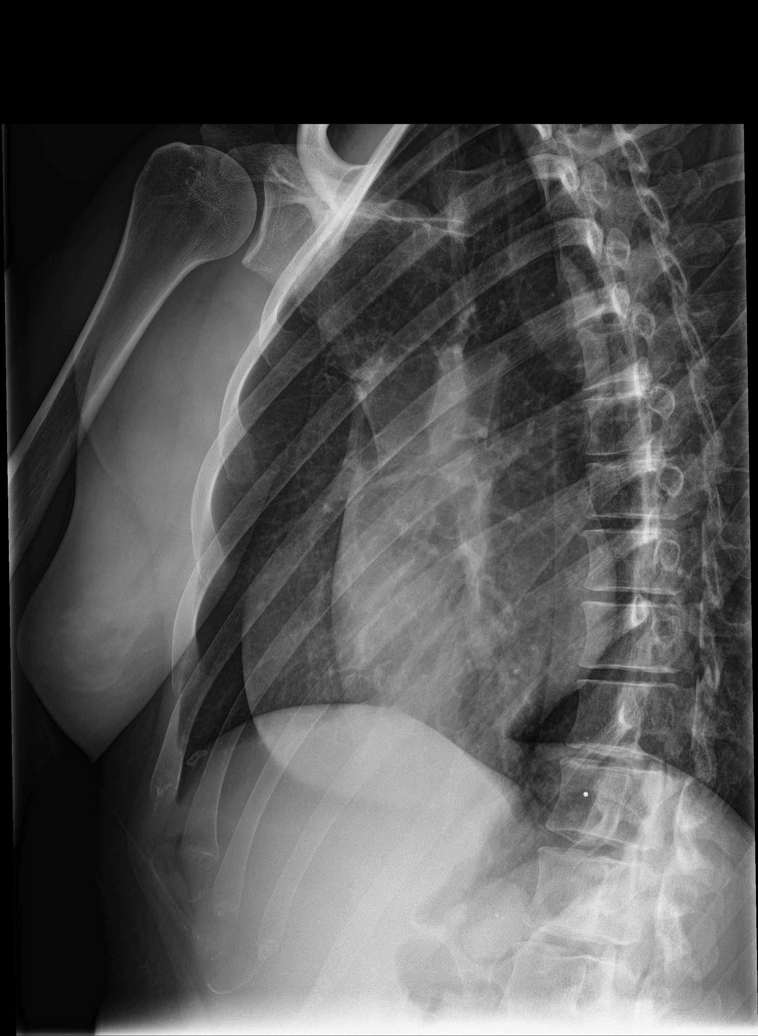

[rib obl (1 of 2)]
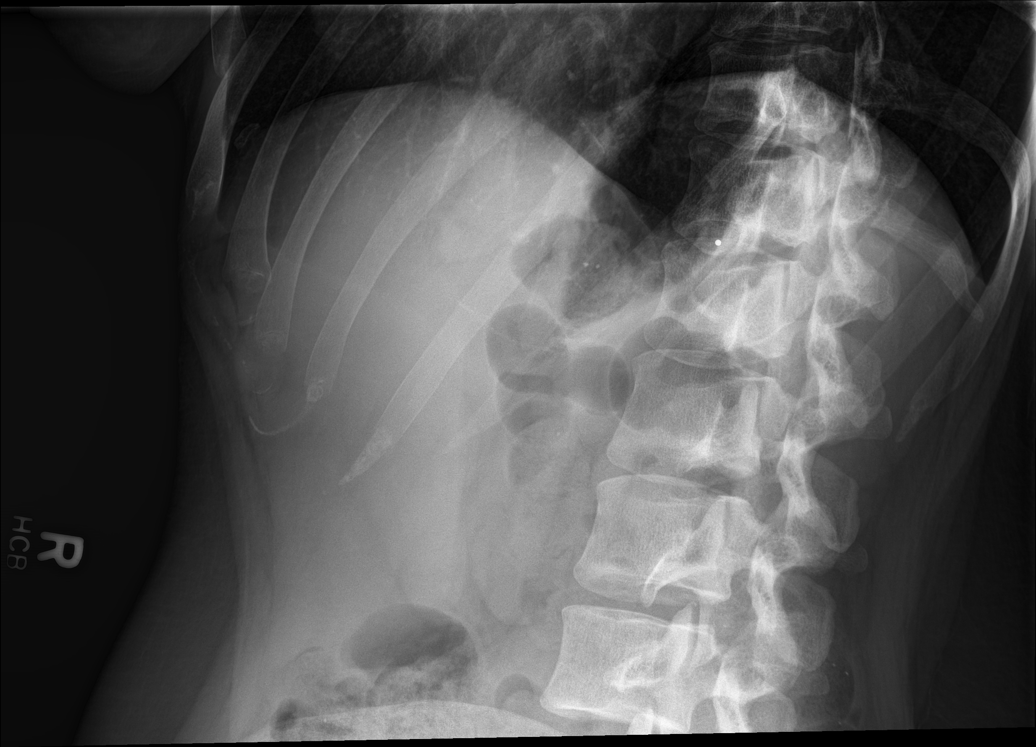

[rib obl (2 of 2)]
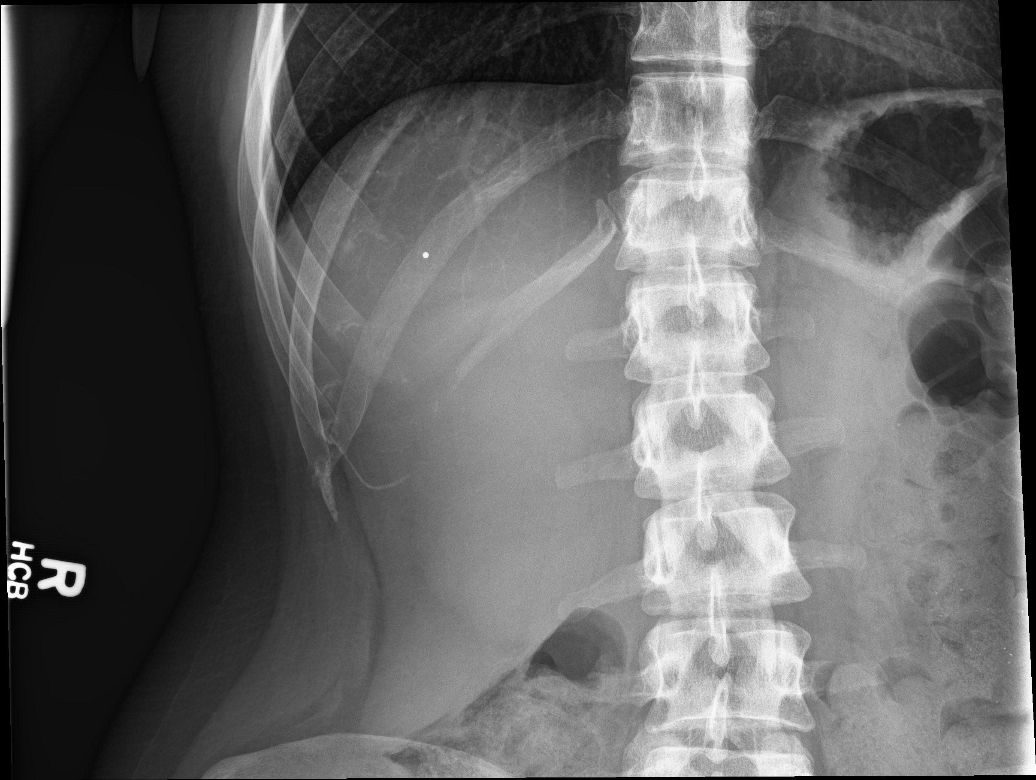

[5 of 5 positions shown; findings below may reference images not displayed]

FINDINGS: No fracture or other bone lesions are seen involving the ribs. There
is no evidence of pneumothorax or pleural effusion. Both lungs are
clear. Heart size and mediastinal contours are within normal limits.
IMPRESSION: Negative for acute displaced rib fracture.

## 2020-08-18 ENCOUNTER — Encounter: Payer: Self-pay | Admitting: Physical Therapy

## 2020-08-18 ENCOUNTER — Other Ambulatory Visit: Payer: Self-pay

## 2020-08-18 ENCOUNTER — Ambulatory Visit: Payer: 59 | Attending: Certified Nurse Midwife | Admitting: Physical Therapy

## 2020-08-18 DIAGNOSIS — R102 Pelvic and perineal pain: Secondary | ICD-10-CM | POA: Diagnosis present

## 2020-08-18 DIAGNOSIS — R278 Other lack of coordination: Secondary | ICD-10-CM | POA: Diagnosis present

## 2020-08-18 DIAGNOSIS — R293 Abnormal posture: Secondary | ICD-10-CM | POA: Diagnosis present

## 2020-08-18 NOTE — Therapy (Signed)
[facility] [address] [place], [place], [address] Phone: [phone]   Fax:  [phone]  Physical Therapy Evaluation  Patient Details  Name: Robin Skinner MRN: [id number] Date of Birth: 1991/08/15 Referring Provider (PT): Haroldine Laws   Encounter Date: 08/18/2020   PT End of Session - 08/18/20 0917    Visit Number 1    Number of Visits 12    Date for PT Re-Evaluation 11/10/20    Authorization Type IE 08/18/2020    PT Start Time 0914    PT Stop Time 0955    PT Time Calculation (min) 41 min    Activity Tolerance Patient tolerated treatment well    Behavior During Therapy [facility] for tasks assessed/performed           Past Medical History:  Diagnosis Date  . ADD (attention deficit disorder)   . GERD (gastroesophageal reflux disease)   . Shoulder impingement syndrome, right     Past Surgical History:  Procedure Laterality Date  . WISDOM TOOTH EXTRACTION      There were no vitals filed for this visit.        [facility] PT Assessment - 08/18/20 0001      Assessment   Medical Diagnosis vaginal irritation    Referring Provider (PT) Haroldine Laws    Hand Dominance Right    Prior Therapy None for this dx      Balance Screen   Has the patient fallen in the past 6 months No          PELVIC HEALTH PHYSICAL THERAPY EVALUATION  SCREENING Red Flags: None Have you had any night sweats? Unexplained weight loss? Saddle anesthesia? Unexplained changes in bowel or bladder habits?  Precautions: None  SUBJECTIVE  Chief Complaint: Patient states that after marriage (October 2020), she has been unable to participate in penetrative sex 2/2 to pain. Patient notes that she has a a "one-finger max" and has pain with self-pleasuring. 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 Patient notes sensitivity of skin in vulvar region. Patient notes using water and a little bit of soap for cleansing. 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 Patient also adds that since this new onset of vaginal pain (October 2020) she has had increased vaginal dryness. Patient notes that vaginal tissue becomes red, like sunburn red, after penetration/stimulation. Patient does report high soda consumption (2L/day).   Pertinent History:  Falls Negative.  Scoliosis Negative. Pulmonary disease/dysfunction Negative. Surgical history: Negative.   Recent Procedures/Tests/Findings: negative for yeast infection  Obstetrical History: G0P0  Gynecological History: Hysterectomy: No  Endometriosis: Negative Last Menstrual Period: 08/11/2020 Pain with exam: Yes   Urinary History: Incontinence: Negative. Nocturia: 0x/night Frequency of urination: every 6 hours Pain with urination: Positive for burning when difficult to initiate flow of urine Difficulty initiating urination: Positive (occasional). Burning on occasion. Frequent UTI: Negative.   Gastrointestinal History: [place] Stool Chart: Type 3-4 Frequency of BMs: 3x/week Pain with defecation: Positive for when constipated (as a result of meclizine/zofran) Straining with defecation: Positive for when constipated.  Sexual activity/pain: Pain with intercourse: Positive.   Initial penetration: Yes  Deep thrustingYes External stimulation: Yes  Location of pain: vaginal entrance with occasional LLQ radiation (Patient notes that she has higher sensitivity on L side for both pleasure and pain.) Current pain:  0/10  Max pain:  8/10 Least pain:  0/10 Pain quality: pain quality: burning and ripping Radiating pain: Yes to  LLQ   Patient assessment of present state: Pain as a result of irritation and irregular penetration  Current activities:  Surgical tech; youtube; video games  Patient Goals:  The ultimate goal is to have intercourse without pain including self-pleasure.  Patient perception of overall  health: Fair  OBJECTIVE  Mental Status Patient is oriented to person, place and time.  Recent memory is intact.  Remote memory is intact.  Attention span and concentration are intact.  Expressive speech is intact.  Patient's fund of knowledge is within normal limits for educational level.  POSTURE/OBSERVATIONS:  Lumbar lordosis: diminished Iliac crest height: equal bilaterally Seated: hips internally rotated and adducted; rounded spine; shoulders forward Standing: forward shoulders, rounded spine, posterior pelvic tilt  GAIT: Grossly WFL  RANGE OF MOTION: deferred 2/2 to extensive history taking/time constraints   LEFT RIGHT  Lumbar forward flexion (65):      Lumbar extension (30):     Lumbar lateral flexion (25):     Thoracic and Lumbar rotation (30 degrees):       Hip Flexion (0-125):      Hip IR (0-45):     Hip ER (0-45):     Hip Abduction (0-40):     Hip extension (0-15):        SENSATION: deferred 2/2 to extensive history taking/time constraints Grossly intact to light touch bilateral LEs as determined by testing dermatomes L2-S2 Proprioception and hot/cold testing deferred on this date  STRENGTH: MMT  deferred 2/2 to extensive history taking/time constraints  RLE LLE  Hip Flexion    Hip Extension    Hip Abduction     Hip Adduction     Hip ER     Hip IR     Knee Extension    Knee Flexion    Dorsiflexion     Plantarflexion (seated)     ABDOMINAL: deferred 2/2 to extensive history taking/time constraints Palpation: Diastasis: Scar mobility: Rib flare:  SPECIAL TESTS: deferred 2/2 to extensive history taking/time constraints  PHYSICAL PERFORMANCE MEASURES: STS: WFL   EXTERNAL PELVIC EXAM: deferred 2/2 to extensive history taking/time constraints Breath coordination: Cued Lengthen: Cued Contraction: Cough:  INTERNAL VAGINAL EXAM: deferred 2/2 to time constraints Introitus Appears:  Skin integrity:  Scar mobility: Strength (PERF):   Symmetry: Palpation: Prolapse:   OUTCOME MEASURES: FOTO (PFDI Pain 21)   ASSESSMENT Patient is a 29 year old presenting to clinic with chief complaints of vaginal pain and irritation. Upon examination, patient demonstrates deficits in PFM extensibility, PFM coordination, posture, pain as evidenced by 8/10 pain with penetration, rounded spine in seated and standing posture. Patient's responses on FOTO outcome measures (PFDI Pain 21) indicate moderate functional limitations/disability/distress. Patient's progress may be limited due to time since onset and presence of some fear avoidance behaviors; however, patient's motivation is advantageous. Patient was able to achieve basic understanding of PFM functions during today's evaluation and responded positively to educational interventions. Patient will benefit from continued skilled therapeutic intervention to address deficits in PFM extensibility, PFM coordination, posture, pain in order to increase function, and improve overall QOL.  EDUCATION Patient educated on prognosis, POC, and approach to  Patient articulated understanding and returned demonstration. Patient will benefit from further education in order to maximize compliance and understanding for long-term therapeutic gains.  TREATMENT  Neuromuscular Re-education: Patient educated on primary functions of the pelvic floor including: posture/balance, sexual pleasure, storage and elimination of waste from the body, abdominal cavity closure, and breath coordination. Patient educated on factors impacting health outcomes  including: clinical care, health behaviors, environment, and socioeconomics.         Objective measurements completed on examination: See above findings.                    PT Long Term Goals - 08/18/20 1238      PT LONG TERM GOAL #1   Title Patient will demonstrate independence with HEP in order to maximize therapeutic gains and improve carryover from  physical therapy sessions to ADLs in the home and community.    Baseline IE: not initiated    Time 12    Period Weeks    Status New    Target Date 11/10/20      PT LONG TERM GOAL #2   Title Patient will decrease worst pain as reported on NPRS by at least 2 points to demonstrate clinically significant reduction in pain in order to restore/improve function and overall QOL.    Baseline IE: 8/10    Time 12    Period Weeks    Status New    Target Date 11/10/20      PT LONG TERM GOAL #3   Title Patient will report being able to return to activities including, but not limited to: partner and self intimacy without pain or limitation to indicate complete resolution of the chief complaint and return to prior level of participation at home and in the community.    Baseline IE: not able    Time 12    Period Weeks    Status New    Target Date 11/10/20      PT LONG TERM GOAL #4   Title Patient will demonstrate understanding of basic self-management/down-regulation of the nervous system for persistent pain condition and stress as evidenced by diaphragmatic breathing without cueing, body scan/progressive relaxation meditation, and improved sleep hygiene including in order to transition to independent management of patient's chief complaint: vaginal pain with penetration.    Baseline IE: not demonstrated    Time 12    Period Weeks    Status New    Target Date 11/10/20      PT LONG TERM GOAL #5   Title Patient will demonstrate improved function as evidenced by a score of 7 or less on FOTO measure for full participation in activities at home and in the community.    Baseline IE: 21    Time 12    Period Weeks    Status New    Target Date 11/10/20                  Plan - 08/18/20 0913    Clinical Impression Statement Patient is a 29 year old presenting to clinic with chief complaints of vaginal pain and irritation. Upon examination, patient demonstrates deficits in PFM extensibility, PFM  coordination, posture, pain as evidenced by 8/10 pain with penetration, rounded spine in seated and standing posture. Patient's responses on FOTO outcome measures (PFDI Pain 21) indicate moderate functional limitations/disability/distress. Patient's progress may be limited due to time since onset and presence of some fear avoidance behaviors; however, patient's motivation is advantageous. Patient was able to achieve basic understanding of PFM functions during today's evaluation and responded positively to educational interventions. Patient will benefit from continued skilled therapeutic intervention to address deficits in PFM extensibility, PFM coordination, posture, pain in order to increase function and improve overall QOL.    Personal Factors and Comorbidities Age;Education;Sex;Comorbidity 3+;Social Background;Past/Current Experience;Time since onset of injury/illness/exacerbation    Comorbidities GERD,  ADD, social anxiety disorder    Examination-Activity Limitations Dressing;Hygiene/Grooming;Other;Toileting    Examination-Participation Restrictions Interpersonal Relationship    Stability/Clinical Decision Making Evolving/Moderate complexity    Clinical Decision Making Moderate    Rehab Potential Good    PT Frequency 1x / week    PT Duration 12 weeks    PT Treatment/Interventions ADLs/Self Care Home Management;Cryotherapy;Electrical Stimulation;Canalith Repostioning;Aquatic Research scientist (life sciences)Therapy;Moist Heat;Gait training;Therapeutic exercise;Patient/family education;Stair training;Balance training;Functional mobility training;Therapeutic activities;Neuromuscular re-education;Taping;Dry needling;Vestibular;Joint Manipulations;Spinal Manipulations;Passive range of motion;Manual techniques    PT Next Visit Plan External PFM assessment; nervous system down-training    PT Home Exercise Plan None provided this visit    Consulted and Agree with Plan of Care Patient           Patient will benefit from skilled  therapeutic intervention in order to improve the following deficits and impairments:  Pain, Postural dysfunction, Improper body mechanics, Impaired flexibility, Increased fascial restricitons, Decreased coordination, Decreased activity tolerance, Dizziness, Impaired sensation, Increased muscle spasms, Decreased skin integrity, Decreased endurance  Visit Diagnosis: Vaginal pain  Other lack of coordination  Abnormal posture     Problem List There are no problems to display for this patient.  Sheria LangKatlin Kwadwo Taras PT, DPT [phone]#[id number] 08/18/2020, 12:40 PM  [facility] [address] [place], [place], [address] Phone: [phone]   Fax:  [phone]  Name: Robin Skinner MRN: [id number] Date of Birth: 08/31/1991

## 2020-08-22 ENCOUNTER — Encounter: Payer: 59 | Admitting: Physical Therapy

## 2020-08-24 ENCOUNTER — Encounter: Payer: Self-pay | Admitting: Physical Therapy

## 2020-08-24 ENCOUNTER — Other Ambulatory Visit: Payer: Self-pay

## 2020-08-24 ENCOUNTER — Ambulatory Visit: Payer: 59 | Admitting: Physical Therapy

## 2020-08-24 DIAGNOSIS — R102 Pelvic and perineal pain: Secondary | ICD-10-CM

## 2020-08-24 DIAGNOSIS — R278 Other lack of coordination: Secondary | ICD-10-CM

## 2020-08-24 DIAGNOSIS — R293 Abnormal posture: Secondary | ICD-10-CM

## 2020-08-24 NOTE — Therapy (Signed)
[facility] [address] [place], [place], [address] Phone: [phone]   Fax:  [phone]  Physical Therapy Treatment  Patient Details  Name: Robin Skinner MRN: [id number] Date of Birth: 05/03/1991 Referring Provider (PT): Haroldine Laws   Encounter Date: 08/24/2020   PT End of Session - 08/24/20 1059    Visit Number 2    Number of Visits 12    Date for PT Re-Evaluation 11/10/20    Authorization Type IE 08/18/2020    PT Start Time 1100    PT Stop Time 1155    PT Time Calculation (min) 55 min    Activity Tolerance Patient tolerated treatment well    Behavior During Therapy [facility] for tasks assessed/performed           Past Medical History:  Diagnosis Date   ADD (attention deficit disorder)    GERD (gastroesophageal reflux disease)    Shoulder impingement syndrome, right     Past Surgical History:  Procedure Laterality Date   WISDOM TOOTH EXTRACTION      There were no vitals filed for this visit.   Subjective Assessment - 08/24/20 1100    Subjective Patient notes that she has started drinking water more regularly. She has been drinking 8 oz/day and as a result has noticed minimal urinary leakage. Patient notes that she had vaginal discharge which is coming from the top of the triangle. The discharge coinciding burning and irritation. Patient adds that vaginal discharge is white creamy with small particles.    Currently in Pain? No/denies          TREATMENT  Pre-treatment assessment: Stork/March Test: R superior movement; L negative SLS limited bilaterally. Beighton Score High Audible pubic symphysis manipulation with hip adductor strength L hip extension 4/5, MMT otherwise grossly 5/5  EXTERNAL PELVIC EXAM: Patient educated on the purpose of the pelvic exam and articulated understanding; patient consented to the exam verbally. Breath coordination: no palpable PFM movement Cued Lengthen: abdominal compensation;  able to coordinate with breath with cueing Cued Contraction: 4/5 MMT with difficulty relaxing PFM after contraction Cough: no palpable PFM movement TTP on L at base of ischiocavernosus  Neuromuscular Re-education: Supine hooklying diaphragmatic breathing with VCs and TCs for downregulation of the nervous system and improved management of IAP Supine hooklying, PFM lengthening with inhalation. VCs and TCs to decrease compensatory patterns and encourage optimal relaxation of the PFM. Patient education on sitting posture and anatomical contributions to PFM tightness as well as strategies to decrease tension.   Patient educated throughout session on appropriate technique and form using multi-modal cueing, HEP, and activity modification. Patient articulated understanding and returned demonstration.  Patient Response to interventions: Comfortable to practice PFM lengthening with coordinated breath pattern for HEP.  ASSESSMENT Patient presents to clinic with excellent motivation to participate in therapy. Patient demonstrates deficits in PFM extensibility, PFM coordination, posture, pain. Patient able to achieve appreciable length in PFM with low belly breath during today's session and responded positively to educational and active interventions. Patient will benefit from continued skilled therapeutic intervention to address remaining deficits in PFM extensibility, PFM coordination, posture, pain in order to increase function, and improve overall QOL.      PT Long Term Goals - 08/18/20 1238      PT LONG TERM GOAL #1   Title Patient will demonstrate independence with HEP in order to maximize therapeutic gains and improve carryover from physical therapy sessions to ADLs in the home and community.  Baseline IE: not initiated    Time 12    Period Weeks    Status New    Target Date 11/10/20      PT LONG TERM GOAL #2   Title Patient will decrease worst pain as reported on NPRS by at least 2  points to demonstrate clinically significant reduction in pain in order to restore/improve function and overall QOL.    Baseline IE: 8/10    Time 12    Period Weeks    Status New    Target Date 11/10/20      PT LONG TERM GOAL #3   Title Patient will report being able to return to activities including, but not limited to: partner and self intimacy without pain or limitation to indicate complete resolution of the chief complaint and return to prior level of participation at home and in the community.    Baseline IE: not able    Time 12    Period Weeks    Status New    Target Date 11/10/20      PT LONG TERM GOAL #4   Title Patient will demonstrate understanding of basic self-management/down-regulation of the nervous system for persistent pain condition and stress as evidenced by diaphragmatic breathing without cueing, body scan/progressive relaxation meditation, and improved sleep hygiene including in order to transition to independent management of patient's chief complaint: vaginal pain with penetration.    Baseline IE: not demonstrated    Time 12    Period Weeks    Status New    Target Date 11/10/20      PT LONG TERM GOAL #5   Title Patient will demonstrate improved function as evidenced by a score of 7 or less on FOTO measure for full participation in activities at home and in the community.    Baseline IE: 21    Time 12    Period Weeks    Status New    Target Date 11/10/20                 Plan - 08/24/20 1059    Clinical Impression Statement Patient presents to clinic with excellent motivation to participate in therapy. Patient demonstrates deficits in PFM extensibility, PFM coordination, posture, pain. Patient able to achieve appreciable length in PFM with low belly breath during today's session and responded positively to educational and active interventions. Patient will benefit from continued skilled therapeutic intervention to address remaining deficits in PFM  extensibility, PFM coordination, posture, pain in order to increase function, and improve overall QOL.    Personal Factors and Comorbidities Age;Education;Sex;Comorbidity 3+;Social Background;Past/Current Experience;Time since onset of injury/illness/exacerbation    Comorbidities GERD, ADD, social anxiety disorder    Examination-Activity Limitations Dressing;Hygiene/Grooming;Other;Toileting    Examination-Participation Restrictions Interpersonal Relationship    Stability/Clinical Decision Making Evolving/Moderate complexity    Rehab Potential Good    PT Frequency 1x / week    PT Duration 12 weeks    PT Treatment/Interventions ADLs/Self Care Home Management;Cryotherapy;Electrical Stimulation;Canalith Repostioning;Aquatic Research scientist (life sciences);Therapeutic exercise;Patient/family education;Stair training;Balance training;Functional mobility training;Therapeutic activities;Neuromuscular re-education;Taping;Dry needling;Vestibular;Joint Manipulations;Spinal Manipulations;Passive range of motion;Manual techniques    PT Next Visit Plan External PFM assessment; nervous system down-training    PT Home Exercise Plan None provided this visit    Consulted and Agree with Plan of Care Patient           Patient will benefit from skilled therapeutic intervention in order to improve the following deficits and impairments:  Pain, Postural dysfunction, Improper body mechanics, Impaired flexibility,  Increased fascial restricitons, Decreased coordination, Decreased activity tolerance, Dizziness, Impaired sensation, Increased muscle spasms, Decreased skin integrity, Decreased endurance  Visit Diagnosis: Vaginal pain  Other lack of coordination  Abnormal posture     Problem List There are no problems to display for this patient.  Sheria Lang PT, DPT [phone]  08/24/2020, 12:55 PM  [facility] [address] [place], [place], [address] Phone:  [phone]   Fax:  [phone]  Name: Robin Skinner MRN: [id number] Date of Birth: 1991/03/02

## 2020-08-31 ENCOUNTER — Encounter: Payer: Self-pay | Admitting: Physical Therapy

## 2020-08-31 ENCOUNTER — Ambulatory Visit: Payer: 59 | Attending: Certified Nurse Midwife | Admitting: Physical Therapy

## 2020-08-31 ENCOUNTER — Other Ambulatory Visit: Payer: Self-pay

## 2020-08-31 DIAGNOSIS — R278 Other lack of coordination: Secondary | ICD-10-CM | POA: Diagnosis present

## 2020-08-31 DIAGNOSIS — R102 Pelvic and perineal pain: Secondary | ICD-10-CM | POA: Diagnosis not present

## 2020-08-31 DIAGNOSIS — R293 Abnormal posture: Secondary | ICD-10-CM | POA: Diagnosis present

## 2020-08-31 NOTE — Therapy (Signed)
[place] [facility] [address] [place], [place], [address] Phone: [phone]   Fax:  [phone]  Physical Therapy Treatment  Patient Details  Name: Robin Skinner MRN: [id number] Date of Birth: 08/02/1991 Referring Provider (PT): Haroldine Laws   Encounter Date: 08/31/2020   PT End of Session - 08/31/20 1111    Visit Number 3    Number of Visits 12    Date for PT Re-Evaluation 11/10/20    Authorization Type IE 08/18/2020    PT Start Time 1105    PT Stop Time 1200    PT Time Calculation (min) 55 min    Activity Tolerance Patient tolerated treatment well    Behavior During Therapy [facility] for tasks assessed/performed           Past Medical History:  Diagnosis Date  . ADD (attention deficit disorder)   . GERD (gastroesophageal reflux disease)   . Shoulder impingement syndrome, right     Past Surgical History:  Procedure Laterality Date  . WISDOM TOOTH EXTRACTION      There were no vitals filed for this visit.   Subjective Assessment - 08/31/20 1108    Subjective Patient presents to clinic and notes that she has had decreased vaginal irritaion. She has been practicing her PFM relaxation with breath and switched to wearing boxers to decrease friction at the vulvar tissues. Patient also reports that she has started taking a vaginal health probiotic. Patient does note that her discharge has not changed, but she was able to perform external stimulation without pain/discomfort and reach orgasm. Patient also notes that she was able to insert 3 fingers using her breath to relax PFM.    Currently in Pain? No/denies          TREATMENT  Neuromuscular Re-education: Patient education on standing and sitting postures for improved lengthening of PFM and decreased tension in the hips and pelvis.  Patient education/discussion on strategies/approaches for nervous system down training including: creating space at home for rest, good nutrition,  collaborative communication with partner.  Patient education on dietary impacts of vaginal and gut flora for decreased irritation of vulvar and vaginal tissue.  Reviewed PFM relaxation with breath.   Patient educated throughout session on appropriate technique and form using multi-modal cueing, HEP, and activity modification. Patient articulated understanding and returned demonstration.  Patient Response to interventions: Comfortable to continue to practice PFM lengthening with coordinated breath pattern as well as partner discussion for stress reduction and including carrot option for increased water/vegetable intake.  ASSESSMENT Patient presents to clinic with excellent motivation to participate in therapy. Patient demonstrates deficits in PFM extensibility, PFM coordination, posture, pain. Patient articulating excellent insights and connections regarding stress and PFM tension during today's session and responded positively to educational interventions. Patient will benefit from continued skilled therapeutic intervention to address remaining deficits in PFM extensibility, PFM coordination, posture, pain in order to increase function, and improve overall QOL.      PT Long Term Goals - 08/18/20 1238      PT LONG TERM GOAL #1   Title Patient will demonstrate independence with HEP in order to maximize therapeutic gains and improve carryover from physical therapy sessions to ADLs in the home and community.    Baseline IE: not initiated    Time 12    Period Weeks    Status New    Target Date 11/10/20      PT LONG TERM GOAL #2   Title Patient will decrease  worst pain as reported on NPRS by at least 2 points to demonstrate clinically significant reduction in pain in order to restore/improve function and overall QOL.    Baseline IE: 8/10    Time 12    Period Weeks    Status New    Target Date 11/10/20      PT LONG TERM GOAL #3   Title Patient will report being able to return to  activities including, but not limited to: partner and self intimacy without pain or limitation to indicate complete resolution of the chief complaint and return to prior level of participation at home and in the community.    Baseline IE: not able    Time 12    Period Weeks    Status New    Target Date 11/10/20      PT LONG TERM GOAL #4   Title Patient will demonstrate understanding of basic self-management/down-regulation of the nervous system for persistent pain condition and stress as evidenced by diaphragmatic breathing without cueing, body scan/progressive relaxation meditation, and improved sleep hygiene including in order to transition to independent management of patient's chief complaint: vaginal pain with penetration.    Baseline IE: not demonstrated    Time 12    Period Weeks    Status New    Target Date 11/10/20      PT LONG TERM GOAL #5   Title Patient will demonstrate improved function as evidenced by a score of 7 or less on FOTO measure for full participation in activities at home and in the community.    Baseline IE: 21    Time 12    Period Weeks    Status New    Target Date 11/10/20                 Plan - 08/31/20 1111    Clinical Impression Statement Patient presents to clinic with excellent motivation to participate in therapy. Patient demonstrates deficits in PFM extensibility, PFM coordination, posture, pain. Patient articulating excellent insights and connections regarding stress and PFM tension during today's session and responded positively to educational interventions. Patient will benefit from continued skilled therapeutic intervention to address remaining deficits in PFM extensibility, PFM coordination, posture, pain in order to increase function, and improve overall QOL.    Personal Factors and Comorbidities Age;Education;Sex;Comorbidity 3+;Social Background;Past/Current Experience;Time since onset of injury/illness/exacerbation    Comorbidities GERD,  ADD, social anxiety disorder    Examination-Activity Limitations Dressing;Hygiene/Grooming;Other;Toileting    Examination-Participation Restrictions Interpersonal Relationship    Stability/Clinical Decision Making Evolving/Moderate complexity    Rehab Potential Good    PT Frequency 1x / week    PT Duration 12 weeks    PT Treatment/Interventions ADLs/Self Care Home Management;Cryotherapy;Electrical Stimulation;Canalith Repostioning;Aquatic Research scientist (life sciences);Therapeutic exercise;Patient/family education;Stair training;Balance training;Functional mobility training;Therapeutic activities;Neuromuscular re-education;Taping;Dry needling;Vestibular;Joint Manipulations;Spinal Manipulations;Passive range of motion;Manual techniques    PT Next Visit Plan PFM stretches, nervous-system downtraining    Consulted and Agree with Plan of Care Patient           Patient will benefit from skilled therapeutic intervention in order to improve the following deficits and impairments:  Pain, Postural dysfunction, Improper body mechanics, Impaired flexibility, Increased fascial restricitons, Decreased coordination, Decreased activity tolerance, Dizziness, Impaired sensation, Increased muscle spasms, Decreased skin integrity, Decreased endurance  Visit Diagnosis: Vaginal pain  Other lack of coordination  Abnormal posture     Problem List There are no problems to display for this patient.  Sheria Lang PT, DPT [phone]  08/31/2020, 1:00 PM  [facility] [address] [place], [place], [address] Phone: [phone]   Fax:  [phone]  Name: Robin Skinner MRN: [id number] Date of Birth: 08-31-1991

## 2020-09-01 ENCOUNTER — Encounter: Payer: 59 | Admitting: Physical Therapy

## 2020-09-07 ENCOUNTER — Ambulatory Visit: Payer: 59 | Admitting: Physical Therapy

## 2020-09-08 ENCOUNTER — Encounter: Payer: 59 | Admitting: Physical Therapy

## 2020-09-14 ENCOUNTER — Other Ambulatory Visit: Payer: Self-pay

## 2020-09-14 ENCOUNTER — Ambulatory Visit: Payer: 59 | Admitting: Physical Therapy

## 2020-09-14 ENCOUNTER — Encounter: Payer: Self-pay | Admitting: Physical Therapy

## 2020-09-14 DIAGNOSIS — R293 Abnormal posture: Secondary | ICD-10-CM

## 2020-09-14 DIAGNOSIS — R102 Pelvic and perineal pain: Secondary | ICD-10-CM

## 2020-09-14 DIAGNOSIS — R278 Other lack of coordination: Secondary | ICD-10-CM

## 2020-09-14 NOTE — Therapy (Signed)
[facility] [address] [place], [place], [address] Phone: [phone]   Fax:  [phone]  Physical Therapy Treatment  Patient Details  Name: Robin Skinner MRN: [id number] Date of Birth: [birth year] Referring Provider (PT): Haroldine Laws   Encounter Date: 09/14/2020   PT End of Session - 09/14/20 1135    Visit Number 4    Number of Visits 12    Date for PT Re-Evaluation 11/10/20    Authorization Type IE 08/18/2020    PT Start Time 1105    PT Stop Time 1200    PT Time Calculation (min) 55 min    Activity Tolerance Patient tolerated treatment well    Behavior During Therapy [facility] for tasks assessed/performed           Past Medical History:  Diagnosis Date  . ADD (attention deficit disorder)   . GERD (gastroesophageal reflux disease)   . Shoulder impingement syndrome, right     Past Surgical History:  Procedure Laterality Date  . WISDOM TOOTH EXTRACTION      There were no vitals filed for this visit.   Subjective Assessment - 09/14/20 1113    Subjective Patient states that she has been feeling really run down/exhausted due to vestibular issues which she is seeing ENT. Patient has follow-up appointments for further procedures. Patient notes she has continued to work on her reverse kegels and has started to work on creating soothing spaces in her home. Patient states that she feels her feminine hygiene products are staying in place better now as opposed to before when it felt too tight. Also, she adds that she was able to participate in penetrative sex with her partner and was able to keep pain controlled/limited.    Currently in Pain? No/denies           TREATMENT  Neuromuscular Re-education: Patient education on sleep hygiene/routine including PFM stretches with diaphragmatic breathing:  Supine single knee to chest  Supine double knee to chest  Supine butterfly  Child's pose Patient discussion on stress  management/coping strategies for improved pain modulation and decreased PFM tension.   Patient educated throughout session on appropriate technique and form using multi-modal cueing, HEP, and activity modification. Patient articulated understanding and returned demonstration.  Patient Response to interventions: Patient amenable to try PFM stretches with sleep routine  ASSESSMENT Patient presents to clinic with excellent motivation to participate in therapy. Patient demonstrates deficits in PFM extensibility, PFM coordination, posture, pain. Patient continued to apply strategies for nervous system downtraining well during today's session and responded positively to educational interventions. Patient will benefit from continued skilled therapeutic intervention to address remaining deficits in PFM extensibility, PFM coordination, posture, pain in order to increase function, and improve overall QOL.       PT Long Term Goals - 08/18/20 1238      PT LONG TERM GOAL #1   Title Patient will demonstrate independence with HEP in order to maximize therapeutic gains and improve carryover from physical therapy sessions to ADLs in the home and community.    Baseline IE: not initiated    Time 12    Period Weeks    Status New    Target Date 11/10/20      PT LONG TERM GOAL #2   Title Patient will decrease worst pain as reported on NPRS by at least 2 points to demonstrate clinically significant reduction in pain in order to restore/improve function and overall QOL.    Baseline IE: 8/10  Time 12    Period Weeks    Status New    Target Date 11/10/20      PT LONG TERM GOAL #3   Title Patient will report being able to return to activities including, but not limited to: partner and self intimacy without pain or limitation to indicate complete resolution of the chief complaint and return to prior level of participation at home and in the community.    Baseline IE: not able    Time 12    Period Weeks     Status New    Target Date 11/10/20      PT LONG TERM GOAL #4   Title Patient will demonstrate understanding of basic self-management/down-regulation of the nervous system for persistent pain condition and stress as evidenced by diaphragmatic breathing without cueing, body scan/progressive relaxation meditation, and improved sleep hygiene including in order to transition to independent management of patient's chief complaint: vaginal pain with penetration.    Baseline IE: not demonstrated    Time 12    Period Weeks    Status New    Target Date 11/10/20      PT LONG TERM GOAL #5   Title Patient will demonstrate improved function as evidenced by a score of 7 or less on FOTO measure for full participation in activities at home and in the community.    Baseline IE: 21    Time 12    Period Weeks    Status New    Target Date 11/10/20                 Plan - 09/14/20 1242    Clinical Impression Statement Patient presents to clinic with excellent motivation to participate in therapy. Patient demonstrates deficits in PFM extensibility, PFM coordination, posture, pain. Patient continued to apply strategies for nervous system downtraining well during today's session and responded positively to educational interventions. Patient will benefit from continued skilled therapeutic intervention to address remaining deficits in PFM extensibility, PFM coordination, posture, pain in order to increase function, and improve overall QOL.    Personal Factors and Comorbidities Age;Education;Sex;Comorbidity 3+;Social Background;Past/Current Experience;Time since onset of injury/illness/exacerbation    Comorbidities GERD, ADD, social anxiety disorder    Examination-Activity Limitations Dressing;Hygiene/Grooming;Other;Toileting    Examination-Participation Restrictions Interpersonal Relationship    Stability/Clinical Decision Making Evolving/Moderate complexity    Rehab Potential Good    PT Frequency 1x /  week    PT Duration 12 weeks    PT Treatment/Interventions ADLs/Self Care Home Management;Cryotherapy;Electrical Stimulation;Canalith Repostioning;Aquatic Research scientist (life sciences);Therapeutic exercise;Patient/family education;Stair training;Balance training;Functional mobility training;Therapeutic activities;Neuromuscular re-education;Taping;Dry needling;Vestibular;Joint Manipulations;Spinal Manipulations;Passive range of motion;Manual techniques    PT Next Visit Plan PFM stretches, nervous-system downtraining    Consulted and Agree with Plan of Care Patient           Patient will benefit from skilled therapeutic intervention in order to improve the following deficits and impairments:  Pain, Postural dysfunction, Improper body mechanics, Impaired flexibility, Increased fascial restricitons, Decreased coordination, Decreased activity tolerance, Dizziness, Impaired sensation, Increased muscle spasms, Decreased skin integrity, Decreased endurance  Visit Diagnosis: Vaginal pain  Other lack of coordination  Abnormal posture     Problem List There are no problems to display for this patient.  Sheria Lang PT, DPT [phone]  09/14/2020, 1:22 PM  [facility] [address] [place], [place], [address] Phone: [phone]   Fax:  [phone]  Name: Robin Skinner MRN: [id number] Date of Birth: 05-17-1991

## 2020-09-15 ENCOUNTER — Encounter: Payer: 59 | Admitting: Physical Therapy

## 2020-09-21 ENCOUNTER — Ambulatory Visit: Payer: 59 | Admitting: Physical Therapy

## 2020-09-22 ENCOUNTER — Encounter: Payer: 59 | Admitting: Physical Therapy

## 2020-09-28 ENCOUNTER — Ambulatory Visit: Payer: 59 | Admitting: Physical Therapy

## 2020-09-29 ENCOUNTER — Encounter: Payer: 59 | Admitting: Physical Therapy

## 2020-10-05 ENCOUNTER — Encounter: Payer: Self-pay | Admitting: Physical Therapy

## 2020-10-05 ENCOUNTER — Ambulatory Visit: Payer: 59 | Attending: Certified Nurse Midwife | Admitting: Physical Therapy

## 2020-10-05 ENCOUNTER — Other Ambulatory Visit: Payer: Self-pay

## 2020-10-05 DIAGNOSIS — R102 Pelvic and perineal pain: Secondary | ICD-10-CM | POA: Diagnosis not present

## 2020-10-05 DIAGNOSIS — R278 Other lack of coordination: Secondary | ICD-10-CM | POA: Diagnosis present

## 2020-10-05 DIAGNOSIS — R293 Abnormal posture: Secondary | ICD-10-CM | POA: Insufficient documentation

## 2020-10-05 NOTE — Therapy (Signed)
[place] [facility] [address] [place], [place], [address] Phone: [phone]   Fax:  [phone]  Physical Therapy Treatment  Patient Details  Name: Robin Skinner MRN: [id number] Date of Birth: 05/27/1991 Referring Provider (PT): Haroldine Laws   Encounter Date: 10/05/2020   PT End of Session - 10/05/20 1247    Visit Number 5    Number of Visits 12    Date for PT Re-Evaluation 11/10/20    Authorization Type IE 08/18/2020    PT Start Time 1107    PT Stop Time 1202    PT Time Calculation (min) 55 min    Activity Tolerance Patient tolerated treatment well    Behavior During Therapy [facility] for tasks assessed/performed           Past Medical History:  Diagnosis Date  . ADD (attention deficit disorder)   . GERD (gastroesophageal reflux disease)   . Shoulder impingement syndrome, right     Past Surgical History:  Procedure Laterality Date  . WISDOM TOOTH EXTRACTION      There were no vitals filed for this visit.   Subjective Assessment - 10/05/20 1111    Subjective Patient reports that she is feeling well today despite having recovered from Covid-19 exposure/symptoms. Patient notes her symptoms were mild. Patient has her hearing and balance assessment tomorrow.    Currently in Pain? No/denies           TREATMENT  Neuromuscular Re-education: Patient education on the physiology of sexual desire and arousal and all of the contributing factors.  Patient discussion on strategies to optimize PFM release prior to partner intimacy. Patient education on VOR exercise for improved management of dizziness.  Patient educated throughout session on appropriate technique and form using multi-modal cueing, HEP, and activity modification. Patient articulated understanding and returned demonstration.  Patient Response to interventions: Patient amenable to consider information provided and continue with current HEP.  ASSESSMENT Patient  presents to clinic with excellent motivation to participate in therapy. Patient demonstrates deficits in PFM extensibility, PFM coordination, posture, pain. Patient engaging in meaningful discussion on factors contributing to PFM tension and response to penetration attempt during today's session and responded positively to educational interventions. Patient will benefit from continued skilled therapeutic intervention to address remaining deficits in PFM extensibility, PFM coordination, posture, pain in order to increase function, and improve overall QOL.    PT Long Term Goals - 08/18/20 1238      PT LONG TERM GOAL #1   Title Patient will demonstrate independence with HEP in order to maximize therapeutic gains and improve carryover from physical therapy sessions to ADLs in the home and community.    Baseline IE: not initiated    Time 12    Period Weeks    Status New    Target Date 11/10/20      PT LONG TERM GOAL #2   Title Patient will decrease worst pain as reported on NPRS by at least 2 points to demonstrate clinically significant reduction in pain in order to restore/improve function and overall QOL.    Baseline IE: 8/10    Time 12    Period Weeks    Status New    Target Date 11/10/20      PT LONG TERM GOAL #3   Title Patient will report being able to return to activities including, but not limited to: partner and self intimacy without pain or limitation to indicate complete resolution of the chief complaint and return to  prior level of participation at home and in the community.    Baseline IE: not able    Time 12    Period Weeks    Status New    Target Date 11/10/20      PT LONG TERM GOAL #4   Title Patient will demonstrate understanding of basic self-management/down-regulation of the nervous system for persistent pain condition and stress as evidenced by diaphragmatic breathing without cueing, body scan/progressive relaxation meditation, and improved sleep hygiene including in  order to transition to independent management of patient's chief complaint: vaginal pain with penetration.    Baseline IE: not demonstrated    Time 12    Period Weeks    Status New    Target Date 11/10/20      PT LONG TERM GOAL #5   Title Patient will demonstrate improved function as evidenced by a score of 7 or less on FOTO measure for full participation in activities at home and in the community.    Baseline IE: 21    Time 12    Period Weeks    Status New    Target Date 11/10/20                 Plan - 10/05/20 1247    Clinical Impression Statement Patient presents to clinic with excellent motivation to participate in therapy. Patient demonstrates deficits in PFM extensibility, PFM coordination, posture, pain. Patient engaging in meaningful discussion on factors contributing to PFM tension and response to penetration attempt during today's session and responded positively to educational interventions. Patient will benefit from continued skilled therapeutic intervention to address remaining deficits in PFM extensibility, PFM coordination, posture, pain in order to increase function, and improve overall QOL.    Personal Factors and Comorbidities Age;Education;Sex;Comorbidity 3+;Social Background;Past/Current Experience;Time since onset of injury/illness/exacerbation    Comorbidities GERD, ADD, social anxiety disorder    Examination-Activity Limitations Dressing;Hygiene/Grooming;Other;Toileting    Examination-Participation Restrictions Interpersonal Relationship    Stability/Clinical Decision Making Evolving/Moderate complexity    Rehab Potential Good    PT Frequency 1x / week    PT Duration 12 weeks    PT Treatment/Interventions ADLs/Self Care Home Management;Cryotherapy;Electrical Stimulation;Canalith Repostioning;Aquatic Research scientist (life sciences);Therapeutic exercise;Patient/family education;Stair training;Balance training;Functional mobility training;Therapeutic  activities;Neuromuscular re-education;Taping;Dry needling;Vestibular;Joint Manipulations;Spinal Manipulations;Passive range of motion;Manual techniques    PT Next Visit Plan PFM stretches, nervous-system downtraining    Consulted and Agree with Plan of Care Patient           Patient will benefit from skilled therapeutic intervention in order to improve the following deficits and impairments:  Pain, Postural dysfunction, Improper body mechanics, Impaired flexibility, Increased fascial restricitons, Decreased coordination, Decreased activity tolerance, Dizziness, Impaired sensation, Increased muscle spasms, Decreased skin integrity, Decreased endurance  Visit Diagnosis: Vaginal pain  Other lack of coordination  Abnormal posture     Problem List There are no problems to display for this patient.  Sheria Lang PT, DPT [phone]  10/05/2020, 2:04 PM  [facility] [address] [place], [place], [address] Phone: [phone]   Fax:  [phone]  Name: Robin Skinner MRN: [id number] Date of Birth: [birth year]

## 2020-10-12 ENCOUNTER — Encounter: Payer: Self-pay | Admitting: Physical Therapy

## 2020-10-12 ENCOUNTER — Other Ambulatory Visit: Payer: Self-pay

## 2020-10-12 ENCOUNTER — Ambulatory Visit: Payer: 59 | Admitting: Physical Therapy

## 2020-10-12 DIAGNOSIS — R293 Abnormal posture: Secondary | ICD-10-CM

## 2020-10-12 DIAGNOSIS — R102 Pelvic and perineal pain: Secondary | ICD-10-CM

## 2020-10-12 DIAGNOSIS — R278 Other lack of coordination: Secondary | ICD-10-CM

## 2020-10-12 NOTE — Therapy (Signed)
[place] [facility] [address] [place], [place], [address] Phone: [phone]   Fax:  [phone]  Physical Therapy Treatment  Patient Details  Name: Robin Skinner MRN: [id number] Date of Birth: July 23, 1991 Referring Provider (PT): Haroldine Laws   Encounter Date: 10/12/2020   PT End of Session - 10/12/20 1108    Visit Number 6    Number of Visits 12    Date for PT Re-Evaluation 11/10/20    Authorization Type IE 08/18/2020    PT Start Time 1100    PT Stop Time 1155    PT Time Calculation (min) 55 min    Activity Tolerance Patient tolerated treatment well    Behavior During Therapy [facility] for tasks assessed/performed           Past Medical History:  Diagnosis Date  . ADD (attention deficit disorder)   . GERD (gastroesophageal reflux disease)   . Shoulder impingement syndrome, right     Past Surgical History:  Procedure Laterality Date  . WISDOM TOOTH EXTRACTION      There were no vitals filed for this visit.   Subjective Assessment - 10/12/20 1104    Subjective Patient notes that she was cleared of inner ear problems and has been referred to another provider for management of vestibular migraines. Patient notes her menstruation has started and she is feeling pretty run down.    Currently in Pain? No/denies          TREATMENT  Neuromuscular Re-education: Patient educated extensively on the circular model of typical female sexual response and all of the biopsychosocial factors impacting sexual function with discussion on options for participation for in psychological therapy independently and/or with her partner that is specific to sexual function.  Patient educated throughout session on appropriate technique and form using multi-modal cueing, HEP, and activity modification. Patient articulated understanding and returned demonstration.  Patient Response to interventions: Patient appreciative of  information.  ASSESSMENT Patient presents to clinic with excellent motivation to participate in therapy. Patient demonstrates deficits in PFM extensibility, PFM coordination, posture, pain. Patient educated on factors impacting sexual function and typical female sexual response during today's session and responded positively to educational interventions. Patient will benefit from continued skilled therapeutic intervention to address remaining deficits in PFM extensibility, PFM coordination, posture, pain in order to increase function, and improve overall QOL.     PT Long Term Goals - 08/18/20 1238      PT LONG TERM GOAL #1   Title Patient will demonstrate independence with HEP in order to maximize therapeutic gains and improve carryover from physical therapy sessions to ADLs in the home and community.    Baseline IE: not initiated    Time 12    Period Weeks    Status New    Target Date 11/10/20      PT LONG TERM GOAL #2   Title Patient will decrease worst pain as reported on NPRS by at least 2 points to demonstrate clinically significant reduction in pain in order to restore/improve function and overall QOL.    Baseline IE: 8/10    Time 12    Period Weeks    Status New    Target Date 11/10/20      PT LONG TERM GOAL #3   Title Patient will report being able to return to activities including, but not limited to: partner and self intimacy without pain or limitation to indicate complete resolution of the chief complaint and return to prior  level of participation at home and in the community.    Baseline IE: not able    Time 12    Period Weeks    Status New    Target Date 11/10/20      PT LONG TERM GOAL #4   Title Patient will demonstrate understanding of basic self-management/down-regulation of the nervous system for persistent pain condition and stress as evidenced by diaphragmatic breathing without cueing, body scan/progressive relaxation meditation, and improved sleep hygiene  including in order to transition to independent management of patient's chief complaint: vaginal pain with penetration.    Baseline IE: not demonstrated    Time 12    Period Weeks    Status New    Target Date 11/10/20      PT LONG TERM GOAL #5   Title Patient will demonstrate improved function as evidenced by a score of 7 or less on FOTO measure for full participation in activities at home and in the community.    Baseline IE: 21    Time 12    Period Weeks    Status New    Target Date 11/10/20                 Plan - 10/12/20 1109    Clinical Impression Statement Patient presents to clinic with excellent motivation to participate in therapy. Patient demonstrates deficits in PFM extensibility, PFM coordination, posture, pain. Patient educated on factors impacting sexual function and typical female sexual response during today's session and responded positively to educational interventions. Patient will benefit from continued skilled therapeutic intervention to address remaining deficits in PFM extensibility, PFM coordination, posture, pain in order to increase function, and improve overall QOL.    Personal Factors and Comorbidities Age;Education;Sex;Comorbidity 3+;Social Background;Past/Current Experience;Time since onset of injury/illness/exacerbation    Comorbidities GERD, ADD, social anxiety disorder    Examination-Activity Limitations Dressing;Hygiene/Grooming;Other;Toileting    Examination-Participation Restrictions Interpersonal Relationship    Stability/Clinical Decision Making Evolving/Moderate complexity    Rehab Potential Good    PT Frequency 1x / week    PT Duration 12 weeks    PT Treatment/Interventions ADLs/Self Care Home Management;Cryotherapy;Electrical Stimulation;Canalith Repostioning;Aquatic Research scientist (life sciences);Therapeutic exercise;Patient/family education;Stair training;Balance training;Functional mobility training;Therapeutic activities;Neuromuscular  re-education;Taping;Dry needling;Vestibular;Joint Manipulations;Spinal Manipulations;Passive range of motion;Manual techniques    PT Next Visit Plan PFM stretches, nervous-system downtraining    Consulted and Agree with Plan of Care Patient           Patient will benefit from skilled therapeutic intervention in order to improve the following deficits and impairments:  Pain, Postural dysfunction, Improper body mechanics, Impaired flexibility, Increased fascial restricitons, Decreased coordination, Decreased activity tolerance, Dizziness, Impaired sensation, Increased muscle spasms, Decreased skin integrity, Decreased endurance  Visit Diagnosis: Vaginal pain  Other lack of coordination  Abnormal posture     Problem List There are no problems to display for this patient.  Sheria Lang PT, DPT [phone]  10/12/2020, 1:38 PM  [place] [facility] [address] [place], [place], [address] Phone: [phone]   Fax:  [phone]  Name: Robin Skinner MRN: [id number] Date of Birth: Oct 28, 1991

## 2020-10-19 ENCOUNTER — Encounter: Payer: Self-pay | Admitting: Physical Therapy

## 2020-10-19 ENCOUNTER — Ambulatory Visit: Payer: 59 | Admitting: Physical Therapy

## 2020-10-19 ENCOUNTER — Other Ambulatory Visit: Payer: Self-pay

## 2020-10-19 DIAGNOSIS — R293 Abnormal posture: Secondary | ICD-10-CM

## 2020-10-19 DIAGNOSIS — R278 Other lack of coordination: Secondary | ICD-10-CM

## 2020-10-19 DIAGNOSIS — R102 Pelvic and perineal pain: Secondary | ICD-10-CM | POA: Diagnosis not present

## 2020-10-19 NOTE — Therapy (Signed)
[place] [facility] [address] [place], [place], [address] Phone: [phone]   Fax:  [phone]  Physical Therapy Treatment  Patient Details  Name: Robin Skinner MRN: [id number] Date of Birth: Oct 27, 1991 Referring Provider (PT): Haroldine Laws   Encounter Date: 10/19/2020   PT End of Session - 10/19/20 1319    Visit Number 7    Number of Visits 12    Date for PT Re-Evaluation 11/10/20    Authorization Type IE 08/18/2020    PT Start Time 1105    PT Stop Time 1200    PT Time Calculation (min) 55 min    Activity Tolerance Patient tolerated treatment well    Behavior During Therapy [facility] for tasks assessed/performed           Past Medical History:  Diagnosis Date   ADD (attention deficit disorder)    GERD (gastroesophageal reflux disease)    Shoulder impingement syndrome, right     Past Surgical History:  Procedure Laterality Date   WISDOM TOOTH EXTRACTION      There were no vitals filed for this visit.   Subjective Assessment - 10/19/20 1109    Subjective Patient notes some mildly improving sleep. Patient notes that her restign pain is 0/10 and feels her changes to undergarments and increased probiotic intake have decreased burning sensation. She also reports that her vaginal lacerations have largely resolved. Patient notes that her pain response to intercourse has been variable; she notes primary difficulty remains to be initial penetration. Patient does note improved use of flex disk absent of contant pain.    Currently in Pain? No/denies           TREATMENT  Neuromuscular Re-education: Patient educated extensively on dilator usage for graded exposure to penetration and improved PFM awareness. Patient education on sensate focus/touch and tantric yoga for improved intimacy with partner absent of triggers for sympathetic nervous system up-regulation.   Patient educated throughout session on appropriate technique and  form using multi-modal cueing, HEP, and activity modification. Patient articulated understanding and returned demonstration.  Patient Response to interventions: Patient appreciative of information.  ASSESSMENT Patient presents to clinic with excellent motivation to participate in therapy. Patient demonstrates deficits in PFM extensibility, PFM coordination, posture, pain. Patient articulating improved awareness of factors that precipitate sympathetic nervous system up regulation during today's session and responded positively to educational interventions. Patient will benefit from continued skilled therapeutic intervention to address remaining deficits in PFM extensibility, PFM coordination, posture, pain in order to increase function, and improve overall QOL.     PT Long Term Goals - 08/18/20 1238      PT LONG TERM GOAL #1   Title Patient will demonstrate independence with HEP in order to maximize therapeutic gains and improve carryover from physical therapy sessions to ADLs in the home and community.    Baseline IE: not initiated    Time 12    Period Weeks    Status New    Target Date 11/10/20      PT LONG TERM GOAL #2   Title Patient will decrease worst pain as reported on NPRS by at least 2 points to demonstrate clinically significant reduction in pain in order to restore/improve function and overall QOL.    Baseline IE: 8/10    Time 12    Period Weeks    Status New    Target Date 11/10/20      PT LONG TERM GOAL #3   Title Patient will  report being able to return to activities including, but not limited to: partner and self intimacy without pain or limitation to indicate complete resolution of the chief complaint and return to prior level of participation at home and in the community.    Baseline IE: not able    Time 12    Period Weeks    Status New    Target Date 11/10/20      PT LONG TERM GOAL #4   Title Patient will demonstrate understanding of basic  self-management/down-regulation of the nervous system for persistent pain condition and stress as evidenced by diaphragmatic breathing without cueing, body scan/progressive relaxation meditation, and improved sleep hygiene including in order to transition to independent management of patient's chief complaint: vaginal pain with penetration.    Baseline IE: not demonstrated    Time 12    Period Weeks    Status New    Target Date 11/10/20      PT LONG TERM GOAL #5   Title Patient will demonstrate improved function as evidenced by a score of 7 or less on FOTO measure for full participation in activities at home and in the community.    Baseline IE: 21    Time 12    Period Weeks    Status New    Target Date 11/10/20                 Plan - 10/19/20 1319    Clinical Impression Statement Patient presents to clinic with excellent motivation to participate in therapy. Patient demonstrates deficits in PFM extensibility, PFM coordination, posture, pain. Patient articulating improved awareness of factors that precipitate sympathetic nervous system up regulation during today's session and responded positively to educational interventions. Patient will benefit from continued skilled therapeutic intervention to address remaining deficits in PFM extensibility, PFM coordination, posture, pain in order to increase function, and improve overall QOL.    Personal Factors and Comorbidities Age;Education;Sex;Comorbidity 3+;Social Background;Past/Current Experience;Time since onset of injury/illness/exacerbation    Comorbidities GERD, ADD, social anxiety disorder    Examination-Activity Limitations Dressing;Hygiene/Grooming;Other;Toileting    Examination-Participation Restrictions Interpersonal Relationship    Stability/Clinical Decision Making Evolving/Moderate complexity    Rehab Potential Good    PT Frequency 1x / week    PT Duration 12 weeks    PT Treatment/Interventions ADLs/Self Care Home  Management;Cryotherapy;Electrical Stimulation;Canalith Repostioning;Aquatic Research scientist (life sciences);Therapeutic exercise;Patient/family education;Stair training;Balance training;Functional mobility training;Therapeutic activities;Neuromuscular re-education;Taping;Dry needling;Vestibular;Joint Manipulations;Spinal Manipulations;Passive range of motion;Manual techniques    PT Next Visit Plan PFM stretches, nervous-system downtraining    Consulted and Agree with Plan of Care Patient           Patient will benefit from skilled therapeutic intervention in order to improve the following deficits and impairments:  Pain, Postural dysfunction, Improper body mechanics, Impaired flexibility, Increased fascial restricitons, Decreased coordination, Decreased activity tolerance, Dizziness, Impaired sensation, Increased muscle spasms, Decreased skin integrity, Decreased endurance  Visit Diagnosis: Vaginal pain  Other lack of coordination  Abnormal posture     Problem List There are no problems to display for this patient.  Sheria Lang PT, DPT [phone]  10/19/2020, 1:26 PM  [place] [facility] [address] [place], [place], [address] Phone: [phone]   Fax:  [phone]  Name: Robin Skinner MRN: [id number] Date of Birth: 1991-03-24

## 2020-11-02 ENCOUNTER — Other Ambulatory Visit: Payer: Self-pay

## 2020-11-02 ENCOUNTER — Ambulatory Visit: Payer: 59 | Attending: Certified Nurse Midwife | Admitting: Physical Therapy

## 2020-11-02 ENCOUNTER — Encounter: Payer: Self-pay | Admitting: Physical Therapy

## 2020-11-02 DIAGNOSIS — R293 Abnormal posture: Secondary | ICD-10-CM

## 2020-11-02 DIAGNOSIS — R278 Other lack of coordination: Secondary | ICD-10-CM

## 2020-11-02 DIAGNOSIS — R102 Pelvic and perineal pain: Secondary | ICD-10-CM

## 2020-11-02 NOTE — Therapy (Signed)
[place] [facility] [address] [place], [place], [address] Phone: [phone]   Fax:  [phone]  Physical Therapy Treatment  Patient Details  Name: Robin Skinner MRN: [id number] Date of Birth: September 04, 1991 Referring Provider (PT): Haroldine Laws   Encounter Date: 11/02/2020   PT End of Session - 11/02/20 1118    Visit Number 8    Number of Visits 12    Date for PT Re-Evaluation 11/10/20    Authorization Type IE 08/18/2020    PT Start Time 1115    PT Stop Time 1155    PT Time Calculation (min) 40 min    Activity Tolerance Patient tolerated treatment well    Behavior During Therapy [facility] for tasks assessed/performed           Past Medical History:  Diagnosis Date   ADD (attention deficit disorder)    GERD (gastroesophageal reflux disease)    Shoulder impingement syndrome, right     Past Surgical History:  Procedure Laterality Date   WISDOM TOOTH EXTRACTION      There were no vitals filed for this visit.   Subjective Assessment - 11/02/20 1115    Subjective Patient arrives to clinic 15 minutes late for her appointment. Patient is currently wearing holter monitor to keep track of heart palpitations (new onset) which are exacerbated by stress. Patient notes cardiology is suspicious of PVCs. Patient notes work has been particularly stressful since returning and she has noticed a correlation between work and palpitations. Patient notes that pelvic pain has returned with irritation and soreness at the urogenital triangle.    Currently in Pain? No/denies           TREATMENT  Neuromuscular Re-education: Patient participated in discussion on nervous system downtraining and pain coping skills for improved QOL.   Patient educated throughout session on appropriate technique and form using multi-modal cueing, HEP, and activity modification. Patient articulated understanding and returned demonstration.  Patient Response to  interventions: Patient appreciative of information.  ASSESSMENT Patient presents to clinic with excellent motivation to participate in therapy. Patient demonstrates deficits in PFM extensibility, PFM coordination, posture, pain. Patient indicated deficits in pain coping skills and stress management skills during today's session and responded positively to educational interventions. Patient will benefit from continued skilled therapeutic intervention to address remaining deficits in PFM extensibility, PFM coordination, posture, pain in order to increase function, and improve overall QOL.     PT Long Term Goals - 08/18/20 1238      PT LONG TERM GOAL #1   Title Patient will demonstrate independence with HEP in order to maximize therapeutic gains and improve carryover from physical therapy sessions to ADLs in the home and community.    Baseline IE: not initiated    Time 12    Period Weeks    Status New    Target Date 11/10/20      PT LONG TERM GOAL #2   Title Patient will decrease worst pain as reported on NPRS by at least 2 points to demonstrate clinically significant reduction in pain in order to restore/improve function and overall QOL.    Baseline IE: 8/10    Time 12    Period Weeks    Status New    Target Date 11/10/20      PT LONG TERM GOAL #3   Title Patient will report being able to return to activities including, but not limited to: partner and self intimacy without pain or limitation to indicate complete  resolution of the chief complaint and return to prior level of participation at home and in the community.    Baseline IE: not able    Time 12    Period Weeks    Status New    Target Date 11/10/20      PT LONG TERM GOAL #4   Title Patient will demonstrate understanding of basic self-management/down-regulation of the nervous system for persistent pain condition and stress as evidenced by diaphragmatic breathing without cueing, body scan/progressive relaxation meditation, and  improved sleep hygiene including in order to transition to independent management of patient's chief complaint: vaginal pain with penetration.    Baseline IE: not demonstrated    Time 12    Period Weeks    Status New    Target Date 11/10/20      PT LONG TERM GOAL #5   Title Patient will demonstrate improved function as evidenced by a score of 7 or less on FOTO measure for full participation in activities at home and in the community.    Baseline IE: 21    Time 12    Period Weeks    Status New    Target Date 11/10/20                 Plan - 11/02/20 1241    Clinical Impression Statement Patient presents to clinic with excellent motivation to participate in therapy. Patient demonstrates deficits in PFM extensibility, PFM coordination, posture, pain. Patient indicated deficits in pain coping skills and stress management skills during today's session and responded positively to educational interventions. Patient will benefit from continued skilled therapeutic intervention to address remaining deficits in PFM extensibility, PFM coordination, posture, pain in order to increase function, and improve overall QOL.    Personal Factors and Comorbidities Age;Education;Sex;Comorbidity 3+;Social Background;Past/Current Experience;Time since onset of injury/illness/exacerbation    Comorbidities GERD, ADD, social anxiety disorder    Examination-Activity Limitations Dressing;Hygiene/Grooming;Other;Toileting    Examination-Participation Restrictions Interpersonal Relationship    Stability/Clinical Decision Making Evolving/Moderate complexity    Rehab Potential Good    PT Frequency 1x / week    PT Duration 12 weeks    PT Treatment/Interventions ADLs/Self Care Home Management;Cryotherapy;Electrical Stimulation;Canalith Repostioning;Aquatic Research scientist (life sciences);Therapeutic exercise;Patient/family education;Stair training;Balance training;Functional mobility training;Therapeutic  activities;Neuromuscular re-education;Taping;Dry needling;Vestibular;Joint Manipulations;Spinal Manipulations;Passive range of motion;Manual techniques    PT Next Visit Plan PFM stretches, nervous-system downtraining    Consulted and Agree with Plan of Care Patient           Patient will benefit from skilled therapeutic intervention in order to improve the following deficits and impairments:  Pain, Postural dysfunction, Improper body mechanics, Impaired flexibility, Increased fascial restricitons, Decreased coordination, Decreased activity tolerance, Dizziness, Impaired sensation, Increased muscle spasms, Decreased skin integrity, Decreased endurance  Visit Diagnosis: Vaginal pain  Other lack of coordination  Abnormal posture     Problem List There are no problems to display for this patient.  Sheria Lang PT, DPT [phone]  11/02/2020, 12:52 PM  [place] [facility] [address] [place], [place], [address] Phone: [phone]   Fax:  [phone]  Name: Willo Yoon MRN: [id number] Date of Birth: 07-30-1991

## 2020-11-16 ENCOUNTER — Encounter: Payer: Self-pay | Admitting: Physical Therapy

## 2020-11-30 ENCOUNTER — Other Ambulatory Visit: Payer: Self-pay

## 2020-11-30 ENCOUNTER — Ambulatory Visit: Payer: 59 | Attending: Certified Nurse Midwife | Admitting: Physical Therapy

## 2020-11-30 ENCOUNTER — Encounter: Payer: Self-pay | Admitting: Physical Therapy

## 2020-11-30 DIAGNOSIS — R278 Other lack of coordination: Secondary | ICD-10-CM | POA: Diagnosis present

## 2020-11-30 DIAGNOSIS — R293 Abnormal posture: Secondary | ICD-10-CM

## 2020-11-30 DIAGNOSIS — R102 Pelvic and perineal pain: Secondary | ICD-10-CM

## 2020-11-30 NOTE — Therapy (Signed)
[facility] [address] [place], [place], [address] Phone: [phone]   Fax:  [phone]  Physical Therapy Treatment  Patient Details  Name: Robin Skinner MRN: [id number] Date of Birth: 05/15/1991 Referring Provider (PT): Linda Hedges   Encounter Date: 11/30/2020   PT End of Session - 11/30/20 1520    Visit Number 9    Number of Visits 12    Date for PT Re-Evaluation 11/30/20    Authorization Type IE 08/18/2020    PT Start Time 1515    PT Stop Time 1555    PT Time Calculation (min) 40 min    Activity Tolerance Patient tolerated treatment well    Behavior During Therapy [facility] for tasks assessed/performed           Past Medical History:  Diagnosis Date   ADD (attention deficit disorder)    GERD (gastroesophageal reflux disease)    Shoulder impingement syndrome, right     Past Surgical History:  Procedure Laterality Date   WISDOM TOOTH EXTRACTION      There were no vitals filed for this visit.   Subjective Assessment - 11/30/20 1517    Subjective Patient presents to clinic 40 minutes later for appointment but is able to be seen due to another patient's cancellation. Patient saw GYN this morning for removal of nexplanon for improved management of menstrual cycle which had been 2x/month. Patient reports that her tension in PFM has been well managed over the past week 2/2 to decreased work stress.    Currently in Pain? No/denies           TREATMENT  Neuromuscular Re-education: Reassessed goals; see below.  Reviewed HEP and discussed significance of stress management for continued improvements with PFM tension and pain.  Patient Response to interventions: Patient in agreement with discharge today to resume once she completes her care with cardiology.  ASSESSMENT Patient presents to clinic with excellent motivation to participate in therapy. Patient demonstrates continued deficits in PFM extensibility, PFM  coordination, posture, pain; however she has made significant progress toward goals. Patient has had a 6-7 point reduction in worst pain on NPRS, and achieved a 17 point reduction in PFDI Pain measure. Patient with good understanding of importance of stress management for continued progress with pelvic floor symptoms, and through discussion we both agree that she is appropriate for discharge from physical therapy at this time with full understanding that a resumption of therapy will be beneficial once other medical concerns are well managed.. Patient will benefit from continued skilled therapeutic intervention in the future to continue the resolution of remaining deficits in PFM extensibility, PFM coordination, posture, pain in order to increase function, and improve overall QOL.      PT Long Term Goals - 11/30/20 1520      PT LONG TERM GOAL #1   Title Patient will demonstrate independence with HEP in order to maximize therapeutic gains and improve carryover from physical therapy sessions to ADLs in the home and community.    Baseline IE: not initiated; 12/1: 25% per patient report    Time 1    Period Days    Status Not Met    Target Date 11/30/20      PT LONG TERM GOAL #2   Title Patient will decrease worst pain as reported on NPRS by at least 2 points to demonstrate clinically significant reduction in pain in order to restore/improve function and overall QOL.    Baseline IE: 8/10; 12/1:  1-2/10    Time 12    Period Weeks    Status Achieved      PT LONG TERM GOAL #3   Title Patient will report being able to return to activities including, but not limited to: partner and self intimacy without pain or limitation to indicate complete resolution of the chief complaint and return to prior level of participation at home and in the community.    Baseline IE: not able; 12/1: patient notes 1-2/10 pain with external stimulation, penetration has not been attempted    Time 1    Period Days    Status  Partially Met    Target Date 11/30/20      PT LONG TERM GOAL #4   Title Patient will demonstrate understanding of basic self-management/down-regulation of the nervous system for persistent pain condition and stress as evidenced by diaphragmatic breathing without cueing, body scan/progressive relaxation meditation, and improved sleep hygiene including in order to transition to independent management of patient's chief complaint: vaginal pain with penetration.    Baseline IE: not demonstrated; 12/1: diaphragmatic breathing IND    Time 1    Period Days    Status Partially Met    Target Date 11/30/20      PT LONG TERM GOAL #5   Title Patient will demonstrate improved function as evidenced by a score of 7 or less on FOTO measure for full participation in activities at home and in the community.    Baseline IE: 21; 12/1: 4    Time 1    Period Days    Status Achieved    Target Date 11/30/20                 Plan - 11/30/20 1520    Clinical Impression Statement Patient presents to clinic with excellent motivation to participate in therapy. Patient demonstrates continued deficits in PFM extensibility, PFM coordination, posture, pain; however she has made significant progress toward goals. Patient has had a 6-7 point reduction in worst pain on NPRS, and achieved a 17 point reduction in PFDI Pain measure. Patient with good understanding of importance of stress management for continued progress with pelvic floor symptoms, and through discussion we both agree that she is appropriate for discharge from physical therapy at this time with full understanding that a resumption of therapy will be beneficial once other medical concerns are well managed.. Patient will benefit from continued skilled therapeutic intervention in the future to continue the resolution of remaining deficits in PFM extensibility, PFM coordination, posture, pain in order to increase function, and improve overall QOL.    Personal  Factors and Comorbidities Age;Education;Sex;Comorbidity 3+;Social Background;Past/Current Experience;Time since onset of injury/illness/exacerbation    Comorbidities GERD, ADD, social anxiety disorder    Examination-Activity Limitations Dressing;Hygiene/Grooming;Other;Toileting    Examination-Participation Restrictions Interpersonal Relationship    Stability/Clinical Decision Making Evolving/Moderate complexity    Rehab Potential Good    PT Frequency One time visit    PT Duration --    PT Treatment/Interventions ADLs/Self Care Home Management;Cryotherapy;Electrical Stimulation;Canalith Repostioning;Aquatic Administrator;Therapeutic exercise;Patient/family education;Stair training;Balance training;Functional mobility training;Therapeutic activities;Neuromuscular re-education;Taping;Dry needling;Vestibular;Joint Manipulations;Spinal Manipulations;Passive range of motion;Manual techniques    PT Next Visit Plan PFM stretches, nervous-system downtraining    Consulted and Agree with Plan of Care Patient           Patient will benefit from skilled therapeutic intervention in order to improve the following deficits and impairments:  Pain, Postural dysfunction, Improper body mechanics, Impaired flexibility, Increased fascial restricitons, Decreased coordination,  Decreased activity tolerance, Dizziness, Impaired sensation, Increased muscle spasms, Decreased skin integrity, Decreased endurance  Visit Diagnosis: Vaginal pain  Other lack of coordination  Abnormal posture     Problem List There are no problems to display for this patient.  Myles Gip PT, DPT [phone]  11/30/2020, 4:03 PM  [facility] [address] [place], [place], [address] Phone: [phone]   Fax:  [phone]  Name: Robin Skinner MRN: [id number] Date of Birth: 07-24-1991

## 2023-01-10 ENCOUNTER — Other Ambulatory Visit: Payer: Self-pay | Admitting: Obstetrics and Gynecology

## 2023-01-28 ENCOUNTER — Encounter
Admission: RE | Admit: 2023-01-28 | Discharge: 2023-01-28 | Disposition: A | Discharge status: Home / Self Care | Payer: 59 | Source: Ambulatory Visit | Attending: Obstetrics and Gynecology | Admitting: Obstetrics and Gynecology

## 2023-01-28 ENCOUNTER — Other Ambulatory Visit: Payer: Self-pay

## 2023-01-28 VITALS — Ht 69.0 in | Wt 267.0 lb

## 2023-01-28 DIAGNOSIS — E039 Hypothyroidism, unspecified: Secondary | ICD-10-CM

## 2023-01-28 DIAGNOSIS — Z01818 Encounter for other preprocedural examination: Secondary | ICD-10-CM

## 2023-01-28 HISTORY — DX: Anemia, unspecified: D64.9

## 2023-01-28 HISTORY — DX: Migraine, unspecified, not intractable, without status migrainosus: G43.909

## 2023-01-28 HISTORY — DX: Depression, unspecified: F32.A

## 2023-01-28 HISTORY — DX: Congenital insufficiency of aortic valve: Q23.1

## 2023-01-28 HISTORY — DX: Hypothyroidism, unspecified: E03.9

## 2023-01-28 HISTORY — DX: Bicuspid aortic valve: Q23.81

## 2023-01-28 HISTORY — DX: Unspecified asthma, uncomplicated: J45.909

## 2023-01-28 NOTE — Patient Instructions (Addendum)
Your procedure is scheduled on: 02/06/2023  Report to the Registration Desk on the 1st floor of the [facility]. To find out your arrival time, please call [phone] between [place] on: 2/6/ 2024 If your arrival time is 6:00 am, do not arrive prior to that time as the [facility] entrance doors do not open until 6:00 am.  REMEMBER: Instructions that are not followed completely may result in serious medical risk, up to and including death; or upon the discretion of your surgeon and anesthesiologist your surgery may need to be rescheduled.  Do not eat food after midnight the night before surgery.  No gum chewing, lozengers or hard candies.  You may however, drink CLEAR liquids up to 2 hours before you are scheduled to arrive for your surgery. Do not drink anything within 2 hours of your scheduled arrival time. Remember the cut off time.  Clear liquids include: - water  - apple juice without pulp - gatorade (not RED colors) - black coffee or tea (Do NOT add milk or creamers to the coffee or tea) Do NOT drink anything that is not on this list.  In addition, your doctor has ordered for you to drink the provided  Ensure Pre-Surgery Clear Carbohydrate Drink   Drinking this carbohydrate drink up to two hours before surgery helps to reduce insulin resistance and improve patient outcomes. Please complete drinking 2 hours prior to scheduled arrival time.  TAKE THESE MEDICATIONS THE MORNING OF SURGERY WITH A SIP OF WATER: buPROPion (WELLBUTRIN XL  busPIRone (BUSPAR)  escitalopram (LEXAPRO)  levothyroxine (SYNTHROID  5. omeprazole -(take one the night before and one on the morning of surgery - helps to prevent nausea after surgery.) 6. pregabalin (LYRICA)   Use inhalers at home on the day of surgery and bring albuterol to the hospital.  Follow recommendations from Cardiologist or PCP regarding stopping Aspirin, Coumadin, Plavix, Eliquis, Pradaxa, or Pletal.  One week prior to  surgery: Stop Anti-inflammatories (NSAIDS) such as Advil, Aleve, Ibuprofen, Motrin, Naproxen, Naprosyn and Aspirin based products such as Excedrin, Goodys Powder, BC Powder. Stop ANY OVER THE COUNTER supplements until after surgery like Vit C, vit D3, B-12, ferrus sulfate,  magnesium oxide, and florastor You may however, continue to take Tylenol if needed for pain up until the day of surgery.  No Alcohol for 24 hours before or after surgery.  No Smoking including e-cigarettes for 24 hours prior to surgery.  No chewable tobacco products for at least 6 hours prior to surgery.  No nicotine patches on the day of surgery.  Do not use any "recreational" drugs for at least a week prior to your surgery.  Please be advised that the combination of cocaine and anesthesia may have negative outcomes, up to and including death. If you test positive for cocaine, your surgery will be cancelled.  On the morning of surgery brush your teeth with toothpaste and water, you may rinse your mouth with mouthwash if you wish. Do not swallow any toothpaste or mouthwash.  Use CHG Soap as directed on instruction sheet.  Do not wear jewelry, make-up, hairpins, clips or nail polish.  Do not wear lotions, powders, or perfumes.   Do not shave body from the neck down 48 hours prior to surgery just in case you cut yourself which could leave a site for infection.  Also, freshly shaved skin may become irritated if using the CHG soap.  Contact lenses, hearing aids and dentures may not be worn into surgery.  Do not bring valuables to the hospital. [facility] is not responsible for any missing/lost belongings or valuables.    Notify your doctor if there is any change in your medical condition (cold, fever, infection).  Wear comfortable clothing (specific to your surgery type) to the hospital.  After surgery, you can help prevent lung complications by doing breathing exercises.  Take deep breaths and cough every 1-2  hours. Your doctor may order a device called an Incentive Spirometer to help you take deep breaths.   If you are being admitted to the hospital overnight, leave your suitcase in the car. After surgery it may be brought to your room.  If you are being discharged the day of surgery, you will not be allowed to drive home. You will need a responsible adult (18 years or older) to drive you home and stay with you that night.   If you are taking public transportation, you will need to have a responsible adult (18 years or older) with you. Please confirm with your physician that it is acceptable to use public transportation.   Please call the [place] Dept. at [phone] if you have any questions about these instructions.  Surgery Visitation Policy:  Patients undergoing a surgery or procedure may have two family members or support persons with them as long as the person is not COVID-19 positive or experiencing its symptoms.   Inpatient Visitation:    Visiting hours are 7 a.m. to 8 p.m. Up to four visitors are allowed at one time in a patient room. The visitors may rotate out with other people during the day. One designated support person (adult) may remain overnight.  Due to an increase in RSV and influenza rates and associated hospitalizations, children ages 69 and under will not be able to visit patients in [facility]. Masks continue to be strongly recommended.

## 2023-01-30 ENCOUNTER — Encounter
Admission: RE | Admit: 2023-01-30 | Discharge: 2023-01-30 | Disposition: A | Discharge status: Home / Self Care | Payer: 59 | Source: Ambulatory Visit | Attending: Obstetrics and Gynecology | Admitting: Obstetrics and Gynecology

## 2023-01-30 ENCOUNTER — Encounter: Payer: Self-pay | Admitting: Urgent Care

## 2023-01-30 DIAGNOSIS — Z01812 Encounter for preprocedural laboratory examination: Secondary | ICD-10-CM | POA: Insufficient documentation

## 2023-01-30 DIAGNOSIS — N921 Excessive and frequent menstruation with irregular cycle: Secondary | ICD-10-CM | POA: Insufficient documentation

## 2023-01-30 LAB — CBC
HCT: 37.1 % (ref 36.0–46.0)
Hemoglobin: 11 g/dL — ABNORMAL LOW (ref 12.0–15.0)
MCH: 23 pg — ABNORMAL LOW (ref 26.0–34.0)
MCHC: 29.6 g/dL — ABNORMAL LOW (ref 30.0–36.0)
MCV: 77.5 fL — ABNORMAL LOW (ref 80.0–100.0)
Platelets: 258 10*3/uL (ref 150–400)
RBC: 4.79 MIL/uL (ref 3.87–5.11)
RDW: 25.9 % — ABNORMAL HIGH (ref 11.5–15.5)
WBC: 6.8 10*3/uL (ref 4.0–10.5)
nRBC: 0 % (ref 0.0–0.2)

## 2023-01-30 LAB — TYPE AND SCREEN
ABO/RH(D): O POS
Antibody Screen: NEGATIVE

## 2023-02-06 ENCOUNTER — Other Ambulatory Visit: Payer: Self-pay

## 2023-02-06 ENCOUNTER — Ambulatory Visit
Admission: RE | Admit: 2023-02-06 | Discharge: 2023-02-06 | Disposition: A | Discharge status: Home / Self Care | Payer: 59 | Attending: Obstetrics and Gynecology | Admitting: Obstetrics and Gynecology

## 2023-02-06 ENCOUNTER — Encounter
Admission: RE | Disposition: A | Discharge status: Home / Self Care | Payer: Self-pay | Source: Home / Self Care | Attending: Obstetrics and Gynecology

## 2023-02-06 ENCOUNTER — Ambulatory Visit: Payer: 59 | Admitting: Certified Registered"

## 2023-02-06 ENCOUNTER — Ambulatory Visit: Payer: 59 | Admitting: Urgent Care

## 2023-02-06 ENCOUNTER — Encounter: Payer: Self-pay | Admitting: Obstetrics and Gynecology

## 2023-02-06 DIAGNOSIS — F32A Depression, unspecified: Secondary | ICD-10-CM | POA: Diagnosis not present

## 2023-02-06 DIAGNOSIS — I2510 Atherosclerotic heart disease of native coronary artery without angina pectoris: Secondary | ICD-10-CM | POA: Diagnosis not present

## 2023-02-06 DIAGNOSIS — N858 Other specified noninflammatory disorders of uterus: Secondary | ICD-10-CM | POA: Diagnosis not present

## 2023-02-06 DIAGNOSIS — Z01818 Encounter for other preprocedural examination: Secondary | ICD-10-CM

## 2023-02-06 DIAGNOSIS — N921 Excessive and frequent menstruation with irregular cycle: Secondary | ICD-10-CM | POA: Diagnosis present

## 2023-02-06 DIAGNOSIS — E039 Hypothyroidism, unspecified: Secondary | ICD-10-CM | POA: Insufficient documentation

## 2023-02-06 DIAGNOSIS — D500 Iron deficiency anemia secondary to blood loss (chronic): Secondary | ICD-10-CM | POA: Diagnosis not present

## 2023-02-06 DIAGNOSIS — K219 Gastro-esophageal reflux disease without esophagitis: Secondary | ICD-10-CM | POA: Insufficient documentation

## 2023-02-06 DIAGNOSIS — J45909 Unspecified asthma, uncomplicated: Secondary | ICD-10-CM | POA: Insufficient documentation

## 2023-02-06 HISTORY — PX: ROBOTIC ASSISTED LAPAROSCOPIC HYSTERECTOMY AND SALPINGECTOMY: SHX6379

## 2023-02-06 HISTORY — PX: CYSTOSCOPY: SHX5120

## 2023-02-06 LAB — POCT PREGNANCY, URINE: Preg Test, Ur: NEGATIVE

## 2023-02-06 LAB — BASIC METABOLIC PANEL WITH GFR
Anion gap: 8 (ref 5–15)
BUN: 8 mg/dL (ref 6–20)
CO2: 22 mmol/L (ref 22–32)
Calcium: 8.9 mg/dL (ref 8.9–10.3)
Chloride: 106 mmol/L (ref 98–111)
Creatinine, Ser: 0.65 mg/dL (ref 0.44–1.00)
GFR, Estimated: >60 mL/min
Glucose, Bld: 73 mg/dL (ref 70–99)
Potassium: 3.3 mmol/L — ABNORMAL LOW (ref 3.5–5.1)
Sodium: 136 mmol/L (ref 135–145)

## 2023-02-06 LAB — ABO/RH: ABO/RH(D): O POS

## 2023-02-06 SURGERY — XI ROBOTIC ASSISTED LAPAROSCOPIC HYSTERECTOMY AND SALPINGECTOMY
Anesthesia: General

## 2023-02-06 MED ORDER — FENTANYL CITRATE (PF) 100 MCG/2ML IJ SOLN
INTRAMUSCULAR | Status: AC
  Filled 2023-02-06: qty 2

## 2023-02-06 MED ORDER — IBUPROFEN 600 MG PO TABS: 600.0000 mg | ORAL_TABLET | Freq: Four times a day (QID) | ORAL | 0 refills | Status: DC

## 2023-02-06 MED ORDER — LIDOCAINE HCL (CARDIAC) PF 100 MG/5ML IV SOSY
PREFILLED_SYRINGE | INTRAVENOUS | Status: DC | PRN
  Administered 2023-02-06: 100 mg via INTRAVENOUS

## 2023-02-06 MED ORDER — ONDANSETRON HCL 4 MG/2ML IJ SOLN
INTRAMUSCULAR | Status: AC
  Filled 2023-02-06: qty 2

## 2023-02-06 MED ORDER — CEFAZOLIN SODIUM-DEXTROSE 2-4 GM/100ML-% IV SOLN
INTRAVENOUS | Status: AC
  Filled 2023-02-06: qty 100

## 2023-02-06 MED ORDER — LIDOCAINE HCL (PF) 2 % IJ SOLN
INTRAMUSCULAR | Status: AC
  Filled 2023-02-06: qty 5

## 2023-02-06 MED ORDER — FENTANYL CITRATE (PF) 100 MCG/2ML IJ SOLN
25.0000 ug | INTRAMUSCULAR | Status: DC | PRN
  Administered 2023-02-06: 50 ug via INTRAVENOUS

## 2023-02-06 MED ORDER — OXYCODONE HCL 5 MG PO TABS
5.0000 mg | ORAL_TABLET | Freq: Once | ORAL | Status: AC
  Administered 2023-02-06: 5 mg via ORAL

## 2023-02-06 MED ORDER — 0.9 % SODIUM CHLORIDE (POUR BTL) OPTIME
TOPICAL | Status: DC | PRN
  Administered 2023-02-06: 500 mL

## 2023-02-06 MED ORDER — LACTATED RINGERS IV SOLN: INTRAVENOUS | Status: DC

## 2023-02-06 MED ORDER — ROCURONIUM BROMIDE 100 MG/10ML IV SOLN
INTRAVENOUS | Status: DC | PRN
  Administered 2023-02-06: 10 mg via INTRAVENOUS
  Administered 2023-02-06: 60 mg via INTRAVENOUS
  Administered 2023-02-06: 10 mg via INTRAVENOUS

## 2023-02-06 MED ORDER — BUPIVACAINE HCL (PF) 0.5 % IJ SOLN
INTRAMUSCULAR | Status: AC
  Filled 2023-02-06: qty 30

## 2023-02-06 MED ORDER — CEFAZOLIN SODIUM-DEXTROSE 2-4 GM/100ML-% IV SOLN
2.0000 g | INTRAVENOUS | Status: AC
  Administered 2023-02-06: 2 g via INTRAVENOUS

## 2023-02-06 MED ORDER — DEXAMETHASONE SODIUM PHOSPHATE 10 MG/ML IJ SOLN
INTRAMUSCULAR | Status: AC
  Filled 2023-02-06: qty 1

## 2023-02-06 MED ORDER — KETOROLAC TROMETHAMINE 30 MG/ML IJ SOLN
INTRAMUSCULAR | Status: DC | PRN
  Administered 2023-02-06: 30 mg via INTRAVENOUS

## 2023-02-06 MED ORDER — MIDAZOLAM HCL 2 MG/2ML IJ SOLN
INTRAMUSCULAR | Status: AC
  Filled 2023-02-06: qty 2

## 2023-02-06 MED ORDER — FENTANYL CITRATE (PF) 100 MCG/2ML IJ SOLN
INTRAMUSCULAR | Status: AC
  Administered 2023-02-06: 50 ug via INTRAVENOUS
  Filled 2023-02-06: qty 2

## 2023-02-06 MED ORDER — ONDANSETRON 4 MG PO TBDP: 4.0000 mg | ORAL_TABLET | Freq: Four times a day (QID) | ORAL | 0 refills | Status: DC | PRN

## 2023-02-06 MED ORDER — CHLORHEXIDINE GLUCONATE 0.12 % MT SOLN: 15.0000 mL | Freq: Once | OROMUCOSAL | Status: AC

## 2023-02-06 MED ORDER — CHLORHEXIDINE GLUCONATE 0.12 % MT SOLN
OROMUCOSAL | Status: AC
  Administered 2023-02-06: 15 mL via OROMUCOSAL
  Filled 2023-02-06: qty 15

## 2023-02-06 MED ORDER — ACETAMINOPHEN 10 MG/ML IV SOLN
INTRAVENOUS | Status: AC
  Filled 2023-02-06: qty 100

## 2023-02-06 MED ORDER — PROPOFOL 10 MG/ML IV BOLUS
INTRAVENOUS | Status: AC
  Filled 2023-02-06: qty 20

## 2023-02-06 MED ORDER — ROCURONIUM BROMIDE 10 MG/ML (PF) SYRINGE
PREFILLED_SYRINGE | INTRAVENOUS | Status: AC
  Filled 2023-02-06: qty 10

## 2023-02-06 MED ORDER — FLUORESCEIN SODIUM 10 % IV SOLN
INTRAVENOUS | Status: AC
  Filled 2023-02-06: qty 5

## 2023-02-06 MED ORDER — OXYCODONE HCL 5 MG PO TABS
ORAL_TABLET | ORAL | Status: AC
  Filled 2023-02-06: qty 1

## 2023-02-06 MED ORDER — ACETAMINOPHEN 10 MG/ML IV SOLN
INTRAVENOUS | Status: DC | PRN
  Administered 2023-02-06: 1000 mg via INTRAVENOUS

## 2023-02-06 MED ORDER — ONDANSETRON HCL 4 MG/2ML IJ SOLN
INTRAMUSCULAR | Status: DC | PRN
  Administered 2023-02-06: 4 mg via INTRAVENOUS

## 2023-02-06 MED ORDER — DEXMEDETOMIDINE HCL IN NACL 80 MCG/20ML IV SOLN
INTRAVENOUS | Status: AC
  Filled 2023-02-06: qty 20

## 2023-02-06 MED ORDER — PROMETHAZINE HCL 25 MG/ML IJ SOLN: 6.2500 mg | INTRAMUSCULAR | Status: DC | PRN

## 2023-02-06 MED ORDER — OXYCODONE-ACETAMINOPHEN 5-325 MG PO TABS: 1.0000 | ORAL_TABLET | Freq: Four times a day (QID) | ORAL | 0 refills | Status: AC | PRN

## 2023-02-06 MED ORDER — PROPOFOL 10 MG/ML IV BOLUS
INTRAVENOUS | Status: DC | PRN
  Administered 2023-02-06: 50 mg via INTRAVENOUS
  Administered 2023-02-06: 200 mg via INTRAVENOUS

## 2023-02-06 MED ORDER — FENTANYL CITRATE (PF) 100 MCG/2ML IJ SOLN
INTRAMUSCULAR | Status: DC | PRN
  Administered 2023-02-06 (×2): 50 ug via INTRAVENOUS
  Administered 2023-02-06: 100 ug via INTRAVENOUS

## 2023-02-06 MED ORDER — DEXMEDETOMIDINE HCL IN NACL 200 MCG/50ML IV SOLN
INTRAVENOUS | Status: DC | PRN
  Administered 2023-02-06 (×2): 10 ug via INTRAVENOUS

## 2023-02-06 MED ORDER — POVIDONE-IODINE 10 % EX SWAB
2.0000 | Freq: Once | CUTANEOUS | Status: AC
  Administered 2023-02-06: 2 via TOPICAL

## 2023-02-06 MED ORDER — BUPIVACAINE HCL 0.5 % IJ SOLN
INTRAMUSCULAR | Status: DC | PRN
  Administered 2023-02-06: 15 mL

## 2023-02-06 MED ORDER — ORAL CARE MOUTH RINSE: 15.0000 mL | Freq: Once | OROMUCOSAL | Status: AC

## 2023-02-06 MED ORDER — SOD CITRATE-CITRIC ACID 500-334 MG/5ML PO SOLN
30.0000 mL | ORAL | Status: DC
  Filled 2023-02-06 (×2): qty 30

## 2023-02-06 MED ORDER — MIDAZOLAM HCL 2 MG/2ML IJ SOLN
INTRAMUSCULAR | Status: DC | PRN
  Administered 2023-02-06: 2 mg via INTRAVENOUS

## 2023-02-06 MED ORDER — DEXAMETHASONE SODIUM PHOSPHATE 10 MG/ML IJ SOLN
INTRAMUSCULAR | Status: DC | PRN
  Administered 2023-02-06: 10 mg via INTRAVENOUS

## 2023-02-06 MED ORDER — SODIUM CHLORIDE 0.9 % IV SOLN: INTRAVENOUS | Status: DC

## 2023-02-06 MED ORDER — KETOROLAC TROMETHAMINE 30 MG/ML IJ SOLN
INTRAMUSCULAR | Status: AC
  Filled 2023-02-06: qty 1

## 2023-02-06 MED ORDER — SUGAMMADEX SODIUM 200 MG/2ML IV SOLN
INTRAVENOUS | Status: DC | PRN
  Administered 2023-02-06: 100 mg via INTRAVENOUS

## 2023-02-06 SURGICAL SUPPLY — 70 items
APPLICATOR ARISTA FLEXITIP XL (MISCELLANEOUS) IMPLANT
BAG URINE DRAIN 2000ML AR STRL (UROLOGICAL SUPPLIES) ×2 IMPLANT
BASIN KIT SINGLE STR (MISCELLANEOUS) ×2 IMPLANT
BLADE SURG SZ11 CARB STEEL (BLADE) ×2 IMPLANT
CANNULA CAP OBTURATR AIRSEAL 8 (CAP) ×2 IMPLANT
CATH FOLEY 2WAY  5CC 16FR (CATHETERS) ×2
CATH URTH 16FR FL 2W BLN LF (CATHETERS) ×2 IMPLANT
COVER TIP SHEARS 8 DVNC (MISCELLANEOUS) ×2 IMPLANT
COVER TIP SHEARS 8MM DA VINCI (MISCELLANEOUS) ×2
DERMABOND ADVANCED .7 DNX12 (GAUZE/BANDAGES/DRESSINGS) ×2 IMPLANT
DRAPE 3/4 80X56 (DRAPES) IMPLANT
DRAPE ARM DVNC X/XI (DISPOSABLE) ×8 IMPLANT
DRAPE DA VINCI XI ARM (DISPOSABLE) ×8
DRAPE ROBOT W/ LEGGING 30X125 (DRAPES) ×2 IMPLANT
DRAPE UNDER BUTTOCK W/FLU (DRAPES) ×2 IMPLANT
ELECT REM PT RETURN 9FT ADLT (ELECTROSURGICAL) ×2
ELECTRODE REM PT RTRN 9FT ADLT (ELECTROSURGICAL) ×2 IMPLANT
GAUZE 4X4 16PLY ~~LOC~~+RFID DBL (SPONGE) ×2 IMPLANT
GLOVE BIO SURGEON STRL SZ7 (GLOVE) ×6 IMPLANT
GLOVE SURG UNDER POLY LF SZ7.5 (GLOVE) ×6 IMPLANT
GOWN STRL REUS W/ TWL LRG LVL3 (GOWN DISPOSABLE) ×6 IMPLANT
GOWN STRL REUS W/ TWL XL LVL3 (GOWN DISPOSABLE) ×2 IMPLANT
GOWN STRL REUS W/TWL LRG LVL3 (GOWN DISPOSABLE) ×6
GOWN STRL REUS W/TWL XL LVL3 (GOWN DISPOSABLE) ×2
HEMOSTAT ARISTA ABSORB 3G PWDR (HEMOSTASIS) IMPLANT
IRRIGATION STRYKERFLOW (MISCELLANEOUS) IMPLANT
IRRIGATOR STRYKERFLOW (MISCELLANEOUS)
IRRIGATOR SUCT 8 DISP DVNC XI (IRRIGATION / IRRIGATOR) IMPLANT
IRRIGATOR SUCTION 8MM XI DISP (IRRIGATION / IRRIGATOR)
IV LACTATED RINGERS 1000ML (IV SOLUTION) ×2 IMPLANT
IV NS 1000ML (IV SOLUTION)
IV NS 1000ML BAXH (IV SOLUTION) ×2 IMPLANT
KIT PINK PAD W/HEAD ARE REST (MISCELLANEOUS) ×2
KIT PINK PAD W/HEAD ARM REST (MISCELLANEOUS) ×2 IMPLANT
KIT TURNOVER CYSTO (KITS) ×2 IMPLANT
LABEL OR SOLS (LABEL) ×2 IMPLANT
MANIFOLD NEPTUNE II (INSTRUMENTS) ×2 IMPLANT
MANIPULATOR UTERINE 4.5 ZUMI (MISCELLANEOUS) ×2 IMPLANT
MANIPULATOR VCARE LG CRV RETR (MISCELLANEOUS) IMPLANT
MANIPULATOR VCARE SML CRV RETR (MISCELLANEOUS) IMPLANT
MANIPULATOR VCARE STD CRV RETR (MISCELLANEOUS) IMPLANT
NEEDLE HYPO 22GX1.5 SAFETY (NEEDLE) ×2 IMPLANT
NS IRRIG 500ML POUR BTL (IV SOLUTION) ×2 IMPLANT
OBTURATOR OPTICAL STANDARD 8MM (TROCAR) ×2
OBTURATOR OPTICAL STND 8 DVNC (TROCAR) ×2
OBTURATOR OPTICALSTD 8 DVNC (TROCAR) ×2 IMPLANT
OCCLUDER COLPOPNEUMO (BALLOONS) IMPLANT
PACK LAP CHOLECYSTECTOMY (MISCELLANEOUS) ×2 IMPLANT
PAD PREP 24X41 OB/GYN DISP (PERSONAL CARE ITEMS) ×2 IMPLANT
SCRUB CHG 4% DYNA-HEX 4OZ (MISCELLANEOUS) ×2 IMPLANT
SEAL CANN UNIV 5-8 DVNC XI (MISCELLANEOUS) ×6 IMPLANT
SEAL XI 5MM-8MM UNIVERSAL (MISCELLANEOUS) ×6
SEALER VESSEL DA VINCI XI (MISCELLANEOUS) ×2
SEALER VESSEL EXT DVNC XI (MISCELLANEOUS) ×2 IMPLANT
SET CYSTO W/LG BORE CLAMP LF (SET/KITS/TRAYS/PACK) ×2 IMPLANT
SET TUBE FILTERED XL AIRSEAL (SET/KITS/TRAYS/PACK) ×2 IMPLANT
SOLUTION ELECTROLUBE (MISCELLANEOUS) ×2 IMPLANT
SPONGE T-LAP 18X18 ~~LOC~~+RFID (SPONGE) IMPLANT
SURGILUBE 2OZ TUBE FLIPTOP (MISCELLANEOUS) ×2 IMPLANT
SUT DVC VLOC 180 0 12IN GS21 (SUTURE) ×2
SUT MNCRL 4-0 (SUTURE) ×2
SUT MNCRL 4-0 27XMFL (SUTURE) ×2
SUT VIC AB 0 CT2 27 (SUTURE) ×2 IMPLANT
SUT VLOC 90 S/L VL9 GS22 (SUTURE) ×2 IMPLANT
SUTURE DVC VLC 180 0 12IN GS21 (SUTURE) IMPLANT
SUTURE MNCRL 4-0 27XMF (SUTURE) ×2 IMPLANT
SYR 10ML LL (SYRINGE) ×2 IMPLANT
SYR 50ML LL SCALE MARK (SYRINGE) ×2 IMPLANT
TRAP FLUID SMOKE EVACUATOR (MISCELLANEOUS) ×2 IMPLANT
WATER STERILE IRR 500ML POUR (IV SOLUTION) ×2 IMPLANT

## 2023-02-06 NOTE — H&P (Signed)
Preoperative History and Physical  Chief Complaint: Robin Skinner is a 32 y.o. G0P0000 here for surgical management of menorrhagia with irregular cycle, anemia due to chronic blood loss. No significant preoperative concerns.  History of Present Illness: 32 y.o. G59P0000 female who presents with cycles that are shorter (20-22 days, has been as short as 16 days). She has been bleeding approximately 100 -200 mL. She can bleed 100 mL in a day with the heavy periods and she can do this at least 2 days. She has also not been able to work due to this and the cramping. Her hemoglobin has been as low as 9.0. She had severe cramping while the IUD was in place. She had the Paragard. She gets vestibular migraines with anything hormonal. She tried Nexplanon and had vestibular migraines with this. She doesn't take pills consistently enough.   At this point she just wants a hysterectomy. She has considered an ablation and understands that she might not get a full benefit from an ablation or at least long-term.   Last pelvic u/s 09/2021: left ovary of multiple cysts (largest 2.1 cm). Otherwise normal.   From Robin Skinner OV note on 12/06/22:  "Shorter cycles and heavy periods since IUD removal (12/13/21)  Cycles for the last 6 months: July 19-25 August (dates not recorded this month) September 12-19 October 10 -17 November 6-11 December 4 - present"  Last pap smear: 01/2022: ASCUS, HPV negative  Proposed surgery: Robot assisted total laparoscopic hysterectomy, bilateral salpingectomy, cystoscopy  Past Medical History:  Diagnosis Date  ADD (attention deficit disorder with hyperactivity)  Allergic rhinitis due to allergen 2001  Anemia 10/2022  Caused by Heavy Periods  Asthma, unspecified asthma severity, unspecified whether complicated, unspecified whether persistent 11/2022  inhaler for acute episodes  Depression January 2022  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   Current Outpatient Medications on File Prior to Visit  Medication Sig Dispense Refill  albuterol 90 mcg/actuation inhaler Inhale 2 inhalations into the lungs every 4 (four) hours as needed for Wheezing 1 each 5  ascorbic acid, vitamin C, (VITAMIN C) 500 MG tablet Take 1 tablet (500 mg total) by mouth 2 (two) times daily Take at the same time as the Ferrous Sulfate to aid in absorption. 180 tablet 3  buPROPion (WELLBUTRIN XL) 150 MG XL tablet Take 1 tablet (150 mg total) by mouth once daily 90 tablet 0  busPIRone (BUSPAR) 7.5 MG tablet Take 1 tablet (7.5 mg total) by mouth 3 (three) times daily 270 tablet 3  cholecalciferol (VITAMIN D3) 2,000 unit capsule TAKE 3 CAPSULES DAILY FOR 3 MONTHS, THEN REDUCE TO TAKE 1 CAPSULE DAILY THEREAFTER FOR VITAMIN D DEFICIENCY. 360 capsule 2  cloNIDine HCL (CATAPRES) 0.1 MG tablet nightly  cyanocobalamin (VITAMIN B12) 1000 MCG tablet Take 2 tablets daily for 2 weeks, then reduce to 1 tablet daily thereafter for Vitamin B12 Deficiency. 90 tablet 0  dexmethylphenidate (FOCALIN XR) 10 MG XR capsule Take 10 mg by mouth every morning  dexmethylphenidate (FOCALIN XR) 10 MG XR capsule Take 1 oral  capsule every morning 30 capsule 0  dexmethylphenidate (FOCALIN) 10 MG tablet 10 mg at bedtime as needed 0  escitalopram oxalate (LEXAPRO) 10 MG tablet Take 1 tablet (10 mg total) by mouth every morning 90 tablet 3  ferrous sulfate 325 (65 FE) MG tablet Take 1 tablet (325 mg total) by mouth 2 (two) times daily with meals Take at the same time at the Ascorbic Acid (Vitamin  C) to aid in absorption 180 tablet 3  levothyroxine (SYNTHROID) 50 MCG tablet TAKE 1 TABLET DAILY ON AN EMPTY STOMACH WITH A GLASS OF WATER AT LEAST 30 TO 60 MINUTES BEFORE BREAKFAST 90 tablet 2  linaCLOtide (LINZESS) 290 mcg capsule Take 1 capsule (290 mcg total) by mouth once daily 90 capsule 1  magnesium 250 mg Tab Take 250 mg by mouth 2 (two) times daily  meclizine (ANTIVERT) 25 mg tablet Take 1 tablet (25 mg total) by mouth 3 (three) times daily as needed for Dizziness 30 tablet 0  naltrexone HCl (NALTREXONE ORAL) Take 2.5 mg by mouth once daily  omeprazole (PRILOSEC) 40 MG DR capsule Take 1 capsule (40 mg total) by mouth 2 (two) times daily before meals 180 capsule 0  ondansetron (ZOFRAN) 4 MG tablet Take 1 tablet (4 mg total) by mouth every 8 (eight) hours as needed for Nausea 30 tablet 0  potassium chloride (KLOR-CON) 10 mEq ER tablet Take 1 tablet (10 mEq total) by mouth once daily 90 tablet 3  rOPINIRole (REQUIP) 0.5 MG tablet Take 1 tablet (0.5 mg total) by mouth at bedtime 90 tablet 3  Saccharomyces boulardii (FLORASTOR) 250 mg capsule Take 250 mg by mouth 2 (two) times daily  acetaminophen (TYLENOL) 500 MG tablet Take 1,000 mg by mouth every 8 (eight) hours as needed (Patient not taking: Reported on 12/30/2022)  pregabalin (LYRICA) 50 MG capsule Take 1 capsule (50 mg total) by mouth 2 (two) times daily 180 capsule 3   No current facility-administered medications on file prior to visit.   No Known Allergies  Social History: reports that she has never smoked. She has never been exposed to tobacco smoke. She has never used smokeless tobacco. She reports current alcohol use of about 2.0 standard drinks of alcohol per week. She reports current drug use. Frequency: 1.00 time per week.  Family History  Problem Relation Age of Onset  Allergic rhinitis Mother  Allergies Mother  Shellfish, Sulfa  Arthritis Father  Allergic rhinitis Father  Thrombocytopenia Father  Thrombocytopenia  Sister  Other Sister  hypermobile spectrum disorder  Diabetes Paternal Uncle  Arthritis Maternal Grandmother  Gout Maternal Grandmother  Alzheimer's disease Maternal Grandmother  High blood pressure (Hypertension) Maternal Grandmother  Osteoarthritis Maternal Grandmother  Fibroids Maternal Grandmother  Neurological disorder Maternal Grandmother  vascular dementia  Arthritis Maternal Grandfather  Hip fracture Maternal Grandfather  Arthritis Paternal Grandmother  Allergic rhinitis Paternal Grandmother  Glaucoma Paternal Grandmother  High blood pressure (Hypertension) Paternal Grandmother  Thrombocytopenia Paternal Grandmother  Multiple sclerosis Paternal Grandmother  Diabetes Paternal Grandmother  Diabetes type II Paternal Grandmother  Arthritis Paternal Grandfather  High blood pressure (Hypertension) Paternal Grandfather  Coronary Artery Disease (Blocked arteries around heart) Paternal Grandfather 33  Colon cancer Paternal Aunt  Great Aunt  Cancer Paternal Aunt  Colon Cancer  Diabetes type II Paternal Uncle   Review of Systems: Noncontributory  PHYSICAL EXAM: BP 131/80   Pulse 86   Temp (!) 97.1 F (36.2 C) (Temporal)   Resp 18   Ht 5\' 9"  (1.753 m)  Wt 117.9 kg   SpO2 96%   BMI 38.40 kg/m   CONSTITUTIONAL: Well-developed, well-nourished female in no acute distress.  HENT: Normocephalic, atraumatic, External right and left ear normal. Oropharynx is clear and moist EYES: Conjunctivae and EOM are normal. Pupils are equal, round, and reactive to light. No scleral icterus.  NECK: Normal range of motion, supple, no masses SKIN: Skin is warm and dry. No rash noted. Not diaphoretic. No erythema. No pallor. [place]: Alert and oriented to person, place, and time. Normal reflexes, muscle tone coordination. No cranial nerve deficit noted. PSYCHIATRIC: Normal mood and affect. Normal behavior. Normal judgment and thought content. CARDIOVASCULAR: Normal heart rate noted, regular  rhythm RESPIRATORY: Effort and breath sounds normal, no problems with respiration noted ABDOMEN: Soft, nontender, nondistended. PELVIC: Deferred MUSCULOSKELETAL: Normal range of motion. No edema and no tenderness. 2+ distal pulses.  Labs: No results found for this or any previous visit (from the past 336 hour(s)).  Imaging Studies:  CT chest PE protocol incl CT angiogram chest w wo contrast  Result Date: 12/30/2022 EXAM: CT CHEST PE PROTOCOL INCL CT CHEST ANGIOGRAM W WO CONTRAST INDICATION: 32 years old Female with Pulmonary embolism (PE) suspected, positive D-dimer, R00.0 Tachycardia, unspecified, R79.89 Other specified abnormal findings of blood chemistry, R06.02 Shortness of breath. TECHNIQUE: Transaxial images were obtained with thin slices through the chest during the rapid injection of 100 of Isovue-370 contrast. Multiplanar reformatted images were done in various planes including three-dimensional postprocessing thin MIPP images on a separate workstation. Auto exposure control was utilized to modulate dose. Where automatic control was not available the dose was manually adjusted. COMPARISON: Chest x-ray earlier today as well as CT scan the abdomen and pelvis 2022 FINDINGS: Technically good opacification of the pulmonary arteries. There are no suspicious filling defects to suggest pulmonary embolism. Heart is not enlarged. Thoracic aorta and great vessels look within normal limits. No mediastinal lymphadenopathy. There is some nodular thickening of the lower pole of the left thyroid lobe suggesting a discrete nodule. Measures approximately 2 cm in maximum dimension. Consider follow-up nonemergent thyroid ultrasound with possible needle biopsy. There is a calcified pulmonary nodule in the anterior basilar segment left lower lobe. Noncalcified nodule within the right middle lobe measuring 4.6 x 2.6 mm has a high probability of being benign in this clinical setting. No further workup is question  unless otherwise clinically directed. Below the diaphragm the visceral structures suggest nothing acute. Osseous structures are unremarkable. IMPRESSION: 1. No evidence of pulmonary embolism. 2. No acute pulmonary pathology. 3. 2 cm inferior pole left thyroid nodule. Follow-up nonemergent ultrasound for further characterization is advised. 4. Small pulmonary nodules are benign in this clinical setting. Electronically Signed by: Iona Hansen, MD, [facility] Radiology Electronically Signed on: 12/30/2022 8:13 PM  Assessment: 1. Menorrhagia with irregular cycle  2. Iron deficiency anemia due to chronic blood loss    Plan: Patient will undergo surgical management with the above-noted surgeyr. The risks of surgery were discussed in detail with the patient including but not limited to: bleeding which may require transfusion or reoperation; infection which may require antibiotics; injury to surrounding organs which may involve bowel, bladder, ureters ; need for additional procedures including laparoscopy or laparotomy; thromboembolic phenomenon, surgical site problems and other postoperative/anesthesia complications. Likelihood of success in alleviating the patient's condition was discussed. Routine postoperative instructions will be reviewed with the patient and her family in detail after surgery. The patient concurred with the proposed plan, giving informed written consent for the surgery. Preoperative  prophylactic antibiotics, as indicated, and SCDs ordered on call to the OR.   Prentice Docker, MD, [facility] 02/06/2023 12:36 PM

## 2023-02-06 NOTE — Op Note (Signed)
Operative Note    Name: Robin Skinner  Date of Service: 02/06/2023   DOB: January 13, 1991  MRN: [id number]    Pre-Operative Diagnosis:  1) Menorrhagia with irregular cycle 2) Anemia due to chronic blood loss  Post-Operative Diagnosis:  1) Menorrhagia with irregular cycle 2) Anemia due to chronic blood loss  Procedures:  1. Robot assisted Total Laparoscopic Hysterectomy, bilateral salpingectomy  2. Cystoscopy  Primary Surgeon: Prentice Docker, MD   EBL: 20 mL   IVF: 1,300 mL   Urine output: 600 mL clear urine at end of case  Specimens: Uterus, cervix, and bilateral fallopian tubes  Drains: none  Complications: None   Disposition: PACU   Condition: Stable   Findings:  1) normal appearing ovaries, bilateral fallopian tubes 2) On cystoscopy: bilateral efflux of urine from the ureteral orifices. No evidence of bladder wall injury.   Procedure Summary:  The patient was taken to the operating room where general anesthesia was administered and found to be adequate. She was placed in the dorsal supine lithotomy position in [place] stirrups and prepped and draped in the usual, sterile fashion. After a timeout was called an indwelling catheter was placed in her bladder.  A sterile speculum was placed in her vagina.  The anterior lip of the cervix was grasped with the single-tooth tenaculum.  The cervix was serially dilated to an 11 Pratt dilator.  The medium Vcare device was placed in accordance to the manufacturer's recommendations.  The tenaculum and speculum were removed.   Attention was turned to the abdomen where after injection of local anesthetic, an 8 mm infraumbilical incision was made with the scalpel. Entry into the abdomen was obtained via Optiview trocar technique (a blunt entry technique with camera visualization through the obturator upon entry). Verification of entry into the abdomen was obtained using opening pressures. The abdomen was insufflated with CO2. The camera was  introduced through the trocar with verification of atraumatic entry.  Right and left abdominal entry sites were created after injection of local anesthetic about 8 cm lateral to the umbilical port in accordance with the Intuitive manufacturer's recommendations.  An additional port was placed 8 cm lateral to the right abdominal port with verification of clearance above the iliac crest by more than 2 cm.  The port sites were 8 mm.  The intuitive trochars were introduced under intra-abdominal camera visualization without difficulty   The XI robot was docked on the patient's left.  Clearance was verified from the patient's legs.  Through the umbilical port the camera was placed.  Through the port attached to arm 3 the monopolar scissors were placed.  Through the port attached to arm 4 the forced bipolar forceps were was placed.  The vessel sealer was attached to port 1.   An inspection was undertaken of the pelvis with the above-noted findings. The bilateral ureters were identified and found to be well away from the operative area of interest. The right fallopian tube was grasped at the fimbriated end and was transected using the vessel sealer along the mesosalpinx in a lateral to medial fashion. The vessel sealer was used to transect the right round ligament and the utero-ovarian ligament was transected. Tissue was divided along the right broad ligament to the level of the internal cervical os. Bladder tissue was dissected off the lower uterine segment and cervix without difficulty. The right uterine artery was skeletonized and identified and after ligation was transected with the Vessel Sealer device. The same procedure was carried out on the  left side. The colpotomy was performed using monopolar electrocautery in a circumferential fashion following the KOH ring.  The uterus and fallopian tubes and cervix were removed through the vagina.   Closure of the vaginal cuff was undertaken using the V-lock stitch in a  running fashion.  All vascular pedicles were inspected and found to be hemostatic.  All instruments removed from the robotic ports.  The robot was undocked from the patient.  The abdomen was then desufflated of CO2 with the aid of 5 deep breaths from anesthesia.  All trochars were then removed.  All skin incisions were closed using 4-0 Monocryl in a subcuticular fashion and reinforced using surgical skin glue.   Cystoscopy was undertaken at this point. The Foley catheter was removed and the 30 cystoscope was gently introduced through the urethra. The bladder survey was undertaken with efflux of urine from both orifices noted. There were no defects noted in the bladder wall. The cystoscope was utilized to fully empty the bladder.  A digital sweep of the vagina was undertaken to ensure no instruments or sponges remained and this was verified to be clear. A gentle speculum exam was performed to verify that no active bleeding was occurring from the vaginal cuff and that she had no vaginal lacerations.  The patient tolerated the procedure well.  Sponge, lap, needle, and instrument counts were correct x 2.  VTE prophylaxis: SCDs. Antibiotic prophylaxis: Ancef 2 grams IV prior to skin incision. She was awakened in the operating room and was taken to the PACU in stable condition.   Prentice Docker, MD, [facility] 02/06/2023 3:38 PM

## 2023-02-06 NOTE — Discharge Instructions (Signed)

## 2023-02-06 NOTE — Anesthesia Procedure Notes (Signed)
Procedure Name: Intubation Date/Time: 02/06/2023 12:49 PM  Performed by: Natasha Mead, CRNAPre-anesthesia Checklist: Patient identified, Emergency Drugs available, Suction available and Patient being monitored Patient Re-evaluated:Patient Re-evaluated prior to induction Oxygen Delivery Method: Circle system utilized Preoxygenation: Pre-oxygenation with 100% oxygen Induction Type: IV induction Ventilation: Mask ventilation without difficulty Laryngoscope Size: Miller and 2 Grade View: Grade I Tube type: Oral Number of attempts: 1 Airway Equipment and Method: Stylet and Oral airway Placement Confirmation: ETT inserted through vocal cords under direct vision, positive ETCO2 and breath sounds checked- equal and bilateral Secured at: 21 cm Tube secured with: Tape Dental Injury: Teeth and Oropharynx as per pre-operative assessment

## 2023-02-06 NOTE — Anesthesia Preprocedure Evaluation (Signed)
Anesthesia Evaluation  Patient identified by MRN, date of birth, ID band Patient awake    Reviewed: Allergy & Precautions, H&P , NPO status , Patient's Chart, lab work & pertinent test results, reviewed documented beta blocker date and time   History of Anesthesia Complications Negative for: history of anesthetic complications  Airway Mallampati: III  TM Distance: >3 FB Neck ROM: full    Dental  (+) Dental Advidsory Given, Teeth Intact   Pulmonary neg shortness of breath, asthma , neg sleep apnea, neg COPD, neg recent URI   Pulmonary exam normal breath sounds clear to auscultation       Cardiovascular Exercise Tolerance: Good (-) hypertension(-) angina (-) CAD, (-) Past MI and (-) Cardiac Stents Normal cardiovascular exam(-) dysrhythmias + Valvular Problems/Murmurs (bicuspid aortic valve)  Rhythm:regular Rate:Normal     Neuro/Psych    Depression    negative neurological ROS  negative psych ROS   GI/Hepatic Neg liver ROS,GERD  ,,  Endo/Other  neg diabetesHypothyroidism    Renal/GU negative Renal ROS  negative genitourinary   Musculoskeletal   Abdominal   Peds  Hematology negative hematology ROS (+)   Anesthesia Other Findings Past Medical History: No date: ADD (attention deficit disorder) No date: Anemia No date: Asthma No date: Bicuspid aortic valve No date: Depression No date: GERD (gastroesophageal reflux disease) No date: Hypothyroidism No date: Migraine No date: Shoulder impingement syndrome, right   Reproductive/Obstetrics negative OB ROS                             Anesthesia Physical Anesthesia Plan  ASA: 2  Anesthesia Plan: General   Post-op Pain Management:    Induction: Intravenous  PONV Risk Score and Plan: 3 and Ondansetron, Dexamethasone, Treatment may vary due to age or medical condition and Midazolam  Airway Management Planned: Oral ETT  Additional  Equipment:   Intra-op Plan:   Post-operative Plan: Extubation in OR  Informed Consent: I have reviewed the patients History and Physical, chart, labs and discussed the procedure including the risks, benefits and alternatives for the proposed anesthesia with the patient or authorized representative who has indicated his/her understanding and acceptance.     Dental Advisory Given  Plan Discussed with: Anesthesiologist, CRNA and Surgeon  Anesthesia Plan Comments:         Anesthesia Quick Evaluation

## 2023-02-06 NOTE — Transfer of Care (Signed)
Immediate Anesthesia Transfer of Care Note  Patient: Robin Skinner  Procedure(s) Performed: XI ROBOTIC ASSISTED LAPAROSCOPIC HYSTERECTOMY AND SALPINGECTOMY (Bilateral) CYSTOSCOPY  Patient Location: PACU  Anesthesia Type:General  Level of Consciousness: drowsy  Airway & Oxygen Therapy: Patient Spontanous Breathing and Patient connected to face mask oxygen  Post-op Assessment: Report given to RN and Post -op Vital signs reviewed and stable  Post vital signs: Reviewed and stable  Last Vitals:  Vitals Value Taken Time  BP 122/78 02/06/23 1545  Temp    Pulse 57 02/06/23 1546  Resp 16 02/06/23 1546  SpO2 100 % 02/06/23 1546  Vitals shown include unvalidated device data.  Last Pain:  Vitals:   02/06/23 1037  TempSrc: Temporal  PainSc: 0-No pain         Complications: No notable events documented.

## 2023-02-07 ENCOUNTER — Encounter: Payer: Self-pay | Admitting: Obstetrics and Gynecology

## 2023-02-08 LAB — SURGICAL PATHOLOGY

## 2023-02-11 NOTE — Anesthesia Postprocedure Evaluation (Signed)
Anesthesia Post Note  Patient: Robin Skinner  Procedure(s) Performed: XI ROBOTIC ASSISTED LAPAROSCOPIC HYSTERECTOMY AND SALPINGECTOMY (Bilateral) CYSTOSCOPY  Patient location during evaluation: PACU Anesthesia Type: General Level of consciousness: awake and alert Pain management: pain level controlled Vital Signs Assessment: post-procedure vital signs reviewed and stable Respiratory status: spontaneous breathing, nonlabored ventilation, respiratory function stable and patient connected to nasal cannula oxygen Cardiovascular status: blood pressure returned to baseline and stable Postop Assessment: no apparent nausea or vomiting Anesthetic complications: no   No notable events documented.   Last Vitals:  Vitals:   02/06/23 1630 02/06/23 1650  BP: 129/89 (!) 140/86  Pulse: 82 87  Resp: 16 18  Temp: 36.5 C 37.1 C  SpO2: 98% 100%    Last Pain:  Vitals:   02/06/23 1650  TempSrc: Temporal  PainSc: 3                  Martha Clan

## 2023-11-04 ENCOUNTER — Other Ambulatory Visit: Payer: Self-pay

## 2023-11-04 ENCOUNTER — Emergency Department
Admission: EM | Admit: 2023-11-04 | Discharge: 2023-11-05 | Disposition: A | Discharge status: Other Acute Inpatient Hospital | Payer: 59 | Attending: Emergency Medicine | Admitting: Emergency Medicine

## 2023-11-04 DIAGNOSIS — F332 Major depressive disorder, recurrent severe without psychotic features: Secondary | ICD-10-CM | POA: Insufficient documentation

## 2023-11-04 DIAGNOSIS — Z79899 Other long term (current) drug therapy: Secondary | ICD-10-CM | POA: Insufficient documentation

## 2023-11-04 DIAGNOSIS — E039 Hypothyroidism, unspecified: Secondary | ICD-10-CM | POA: Insufficient documentation

## 2023-11-04 DIAGNOSIS — R45850 Homicidal ideations: Secondary | ICD-10-CM

## 2023-11-04 DIAGNOSIS — R4587 Impulsiveness: Secondary | ICD-10-CM | POA: Insufficient documentation

## 2023-11-04 DIAGNOSIS — Z7951 Long term (current) use of inhaled steroids: Secondary | ICD-10-CM | POA: Insufficient documentation

## 2023-11-04 DIAGNOSIS — Y900 Blood alcohol level of less than 20 mg/100 ml: Secondary | ICD-10-CM | POA: Insufficient documentation

## 2023-11-04 DIAGNOSIS — R45851 Suicidal ideations: Secondary | ICD-10-CM

## 2023-11-04 DIAGNOSIS — J45909 Unspecified asthma, uncomplicated: Secondary | ICD-10-CM | POA: Insufficient documentation

## 2023-11-04 LAB — COMPREHENSIVE METABOLIC PANEL
ALT: 9 U/L (ref 0–44)
AST: 13 U/L — ABNORMAL LOW (ref 15–41)
Albumin: 4.2 g/dL (ref 3.5–5.0)
Alkaline Phosphatase: 100 U/L (ref 38–126)
Anion gap: 8 (ref 5–15)
BUN: 7 mg/dL (ref 6–20)
CO2: 25 mmol/L (ref 22–32)
Calcium: 8.9 mg/dL (ref 8.9–10.3)
Chloride: 104 mmol/L (ref 98–111)
Creatinine, Ser: 0.73 mg/dL (ref 0.44–1.00)
GFR, Estimated: >60 mL/min (ref >60)
Glucose, Bld: 107 mg/dL — ABNORMAL HIGH (ref 70–99)
Potassium: 4 mmol/L (ref 3.5–5.1)
Sodium: 137 mmol/L (ref 135–145)
Total Bilirubin: 0.2 mg/dL (ref <1.2)
Total Protein: 8.4 g/dL — ABNORMAL HIGH (ref 6.5–8.1)

## 2023-11-04 LAB — CBC
HCT: 38.3 % (ref 36.0–46.0)
Hemoglobin: 12 g/dL (ref 12.0–15.0)
MCH: 25.7 pg — ABNORMAL LOW (ref 26.0–34.0)
MCHC: 31.3 g/dL (ref 30.0–36.0)
MCV: 82 fL (ref 80.0–100.0)
Platelets: 270 10*3/uL (ref 150–400)
RBC: 4.67 MIL/uL (ref 3.87–5.11)
RDW: 16.9 % — ABNORMAL HIGH (ref 11.5–15.5)
WBC: 6.5 10*3/uL (ref 4.0–10.5)
nRBC: 0 % (ref 0.0–0.2)

## 2023-11-04 LAB — POC URINE PREG, ED: Preg Test, Ur: NEGATIVE

## 2023-11-04 LAB — ACETAMINOPHEN LEVEL: Acetaminophen (Tylenol), Serum: <10 ug/mL — ABNORMAL LOW (ref 10–30)

## 2023-11-04 LAB — ETHANOL: Alcohol, Ethyl (B): <10 mg/dL (ref <10)

## 2023-11-04 LAB — SALICYLATE LEVEL: Salicylate Lvl: <7 mg/dL — ABNORMAL LOW (ref 7.0–30.0)

## 2023-11-04 MED ORDER — ACETAMINOPHEN 325 MG PO TABS
650.0000 mg | ORAL_TABLET | Freq: Once | ORAL | Status: AC
  Administered 2023-11-04: 650 mg via ORAL
  Filled 2023-11-04: qty 2

## 2023-11-04 NOTE — ED Notes (Signed)
Pt received snack and vitals taken at this time.

## 2023-11-04 NOTE — ED Provider Notes (Signed)
[facility] Provider Note    Event Date/Time   First MD Initiated Contact with Patient 11/04/23 1610     (approximate)   History   Suicidal   HPI Helina Hullum is a 32 y.o. female with PMH of depression, prior suicidal ideation, migraines presenting today for SI and HI.  Patient states she has had worsening suicidal ideation over the past several nights.  Last night she also started to have homicidal ideations towards her husband and animals which she has not had in the past.  She had thoughts of wanting to strangle them or kill them with a pocket knife.  Denies any hallucinations.  No prior suicide attempts.  No missed doses of her home medications.  Reviewed prior chart notes regarding psychiatric history.     Physical Exam   Triage Vital Signs: ED Triage Vitals 11/04/23 1538  Encounter Vitals Group     BP (!) 153/95     Systolic BP Percentile      Diastolic BP Percentile      Pulse Rate 98     Resp 18     Temp 98.2 F (36.8 C)     Temp Source Oral     SpO2 100 %     Weight 270 lb (122.5 kg)     Height 5\' 9"  (1.753 m)     Head Circumference      Peak Flow      Pain Score 0     Pain Loc      Pain Education      Exclude from Growth Chart     Most recent vital signs: Vitals:   11/04/23 1538 11/04/23 1943  BP: (!) 153/95 122/75  Pulse: 98 85  Resp: 18 16  Temp: 98.2 F (36.8 C) 98.6 F (37 C)  SpO2: 100% 100%   I have reviewed the vital signs. General:  Awake, alert, no acute distress. Head:  Normocephalic, Atraumatic. EENT:  PERRL, EOMI, Oral mucosa pink and moist, Neck is supple. Cardiovascular: Regular rate, 2+ distal pulses. Respiratory:  Normal respiratory effort, symmetrical expansion, no distress.   Extremities:  Moving all four extremities through full ROM without pain.   Neuro:  Alert and oriented.  Interacting appropriately.   Skin:  Warm, dry, no rash.   Psych: SI and HI present   ED Results / Procedures /  Treatments   Labs (all labs ordered are listed, but only abnormal results are displayed) Labs Reviewed  COMPREHENSIVE METABOLIC PANEL - Abnormal; Notable for the following components:      Result Value   Glucose, Bld 107 (*)    Total Protein 8.4 (*)    AST 13 (*)    All other components within normal limits  SALICYLATE LEVEL - Abnormal; Notable for the following components:   Salicylate Lvl <7.0 (*)    All other components within normal limits  ACETAMINOPHEN LEVEL - Abnormal; Notable for the following components:   Acetaminophen (Tylenol), Serum <10 (*)    All other components within normal limits  CBC - Abnormal; Notable for the following components:   MCH 25.7 (*)    RDW 16.9 (*)    All other components within normal limits  URINE DRUG SCREEN, QUALITATIVE ([facility] ONLY) - Abnormal; Notable for the following components:   Amphetamines, Ur Screen POSITIVE (*)    Cannabinoid 50 Ng, Ur [place] POSITIVE (*)    All other components within normal limits  ETHANOL  POC URINE PREG, ED  EKG    RADIOLOGY    PROCEDURES:  Critical Care performed: No  Procedures   MEDICATIONS ORDERED IN ED: Medications - No data to display   IMPRESSION / MDM / ASSESSMENT AND PLAN / ED COURSE  I reviewed the triage vital signs and the nursing notes.                              Differential diagnosis includes, but is not limited to, worsening depression with suicidality and homicidal ideation  Patient's presentation is most consistent with acute presentation with potential threat to life or bodily function.  The patient has been placed in psychiatric observation due to the need to provide a safe environment for the patient while obtaining psychiatric consultation and evaluation, as well as ongoing medical and medication management to treat the patient's condition.  The patient has not been placed under full IVC at this time.  Patient is a 32 year old female presenting today for worsening  suicidal ideation and homicidal ideation.  Patient presenting voluntary.  No acute medical concerns today.  Medically cleared and psychiatry was consulted.  Psychiatry recommending admission for further care.  Patient signed out pending placement.     FINAL CLINICAL IMPRESSION(S) / ED DIAGNOSES   Final diagnoses:  Suicidal ideation  Homicidal ideation     Rx / DC Orders   ED Discharge Orders     None        Note:  This document was prepared using Dragon voice recognition software and may include unintentional dictation errors.   Janith Lima, MD 11/04/23 2253

## 2023-11-04 NOTE — BH Assessment (Signed)
Comprehensive Clinical Assessment (CCA) Note  11/04/2023 Robin Skinner [id number]  Chief Complaint: Patient is a 32 year old female presenting to [facility] ED voluntarily. Per triage note Patient states "for the past few nights when the sun goes down I have had suicidal thoughts. I want to kill my husband and animals first then myself. I thought about cutting them in the jugular." Patient reports having suicidal ideations in the past. During assessment patient appears alert and oriented x4, calm and cooperative. Patient reports "I've been feeling worse the last couple of nights, I wanted to kill myself and I wanted to take my family with me." Patient reports this being her first HI thought in her life "I just don't want to exist." Patient reports some financial stress "I was working full time but got moved to [employer] and now I can't afford my bills." Patient reports currently having a psychiatrist and is prescribed medication for ADHD, Depression and Anxiety but does not have a therapist. Patient also reports poor sleep and a increased appetite "I stress eat." When asked about current SI/HI patient is unable to contract for safety and reports that she does not feel safe being home right now. Patient denies AH/VH.  Per Psyc NP Lerry Liner patient is recommended for Inpatient Chief Complaint  Patient presents with   Suicidal   Visit Diagnosis: Major Depressive Disorder, recurrent episode, severe    CCA Screening, Triage and Referral (STR)  Patient Reported Information How did you hear about [place]? Self  Referral name: No data recorded Referral phone number: No data recorded  Whom do you see for routine medical problems? No data recorded Practice/Facility Name: No data recorded Practice/Facility Phone Number: No data recorded Name of Contact: No data recorded Contact Number: No data recorded Contact Fax Number: No data recorded Prescriber Name: No data recorded Prescriber Address (if known): No data  recorded  What Is the Reason for Your Visit/Call Today? Patient states "for the past few nights when the sun goes down I have had suicidal thoughts. I want to kill my husband and animals first then myself. I thought about cutting them in the jugular." Patient reports having suicidal ideations in the past.  How Long Has This Been Causing You Problems? > than 6 months  What Do You Feel Would Help You the Most Today? Treatment for Depression or other mood problem   Have You Recently Been in Any Inpatient Treatment (Hospital/Detox/Crisis Center/28-Day Program)? No data recorded Name/Location of Program/Hospital:No data recorded How Long Were You There? No data recorded When Were You Discharged? No data recorded  Have You Ever Received Services From [facility] Before? No data recorded Who Do You See at [facility]? No data recorded  Have You Recently Had Any Thoughts About Hurting Yourself? Yes  Are You Planning to Commit Suicide/Harm Yourself At This time? No   Have you Recently Had Thoughts About Hurting Someone Karolee Ohs? Yes  Explanation: Patient reports having thoughts to harm her family   Have You Used Any Alcohol or Drugs in the Past 24 Hours? No  How Long Ago Did You Use Drugs or Alcohol? No data recorded What Did You Use and How Much? No data recorded  Do You Currently Have a Therapist/Psychiatrist? Yes  Name of Therapist/Psychiatrist: Patient reports having a psychiatrist   Have You Been Recently Discharged From Any Office Practice or Programs? No  Explanation of Discharge From Practice/Program: No data recorded    CCA Screening Triage Referral Assessment Type of Contact: Face-to-Face  Is this Initial or Reassessment? No data recorded Date Telepsych consult ordered in [facility]:  No data recorded Time Telepsych consult ordered in [facility]:  No data recorded  Patient Reported Information Reviewed? No data recorded Patient Left Without Being Seen? No data recorded Reason for  Not Completing Assessment: No data recorded  Collateral Involvement: No data recorded  Does Patient Have a Court Appointed Legal Guardian? No data recorded Name and Contact of Legal Guardian: No data recorded If Minor and Not Living with Parent(s), Who has Custody? No data recorded Is CPS involved or ever been involved? Never  Is APS involved or ever been involved? Never   Patient Determined To Be At Risk for Harm To Self or Others Based on Review of Patient Reported Information or Presenting Complaint? Yes, for Self-Harm  Method: No data recorded Availability of Means: No data recorded Intent: No data recorded Notification Required: No data recorded Additional Information for Danger to Others Potential: No data recorded Additional Comments for Danger to Others Potential: No data recorded Are There Guns or Other Weapons in Your Home? No  Types of Guns/Weapons: No data recorded Are These Weapons Safely Secured?                            No data recorded Who Could Verify You Are Able To Have These Secured: No data recorded Do You Have any Outstanding Charges, Pending Court Dates, Parole/Probation? No data recorded Contacted To Inform of Risk of Harm To Self or Others: No data recorded  Location of Assessment: [facility] ED   Does Patient Present under Involuntary Commitment? No  IVC Papers Initial File Date: No data recorded  [place] of Residence: [place]   Patient Currently Receiving the Following Services: Medication Management   Determination of Need: Emergent (2 hours)   Options For Referral: No data recorded    CCA Biopsychosocial Intake/Chief Complaint:  No data recorded Current Symptoms/Problems: No data recorded  Patient Reported Schizophrenia/Schizoaffective Diagnosis in Past: No   Strengths: Patient is able to communicate her needs; has a supportive family  Preferences: No data recorded Abilities: No data recorded  Type of Services Patient Feels are  Needed: No data recorded  Initial Clinical Notes/Concerns: No data recorded  Mental Health Symptoms Depression:   Change in energy/activity; Difficulty Concentrating; Fatigue; Hopelessness; Sleep (too much or little)   Duration of Depressive symptoms:  Greater than two weeks   Mania:   None   Anxiety:    Difficulty concentrating; Fatigue; Restlessness   Psychosis:   None   Duration of Psychotic symptoms: No data recorded  Trauma:   None   Obsessions:   None   Compulsions:   None   Inattention:   None   Hyperactivity/Impulsivity:   None   Oppositional/Defiant Behaviors:   None   Emotional Irregularity:   None   Other Mood/Personality Symptoms:  No data recorded   Mental Status Exam Appearance and self-care  Stature:   Average   Weight:   Overweight   Clothing:   Casual   Grooming:   Normal   Cosmetic use:   None   Posture/gait:   Normal   Motor activity:   Not Remarkable   Sensorium  Attention:   Normal   Concentration:   Normal   Orientation:   X5   Recall/memory:   Normal   Affect and Mood  Affect:   Appropriate   Mood:   Depressed   Relating  Eye contact:   Normal   Facial expression:   Depressed   Attitude toward examiner:   Cooperative   Thought and Language  Speech flow:  Clear and Coherent   Thought content:   Appropriate to Mood and Circumstances   Preoccupation:   None   Hallucinations:   None   Organization:  No data recorded  [employer] of Knowledge:   Fair   Intelligence:   Average   Abstraction:   Normal   Judgement:   Good   Reality Testing:   Realistic   Insight:   Good   Decision Making:   Normal   Social Functioning  Social Maturity:   Responsible   Social Judgement:   Normal   Stress  Stressors:   Surveyor, quantity; Illness   Coping Ability:   Contractor Deficits:   None   Supports:   Family; Friends/Service system      Religion: Religion/Spirituality Are You A Religious Person?: No  Leisure/Recreation: Leisure / Recreation Do You Have Hobbies?: No  Exercise/Diet: Exercise/Diet Do You Exercise?: No Have You Gained or Lost A Significant Amount of Weight in the Past Six Months?: No Do You Follow a Special Diet?: No Do You Have Any Trouble Sleeping?: Yes Explanation of Sleeping Difficulties: Patient reports poor sleep   CCA Employment/Education Employment/Work Situation: Employment / Work Situation Employment Situation: Employed Work Stressors: Patient reports that she was full time but is now [employer] and not earning enough money Patient's Job has Been Impacted by Current Illness: Yes Describe how Patient's Job has Been Impacted: Patient reports a increase in depression Has Patient ever Been in the [employer]?: No  Education: Education Is Patient Currently Attending School?: No Did You Have An Individualized Education Program (IIEP): No Did You Have Any Difficulty At [employer]?: No Patient's Education Has Been Impacted by Current Illness: No   CCA Family/Childhood History Family and Relationship History: Family history Marital status: Single Does patient have children?: Yes How many children?: 1 How is patient's relationship with their children?: Patient has a good realtionship with her child  Childhood History:  Childhood History Did patient suffer any verbal/emotional/physical/sexual abuse as a child?: No Did patient suffer from severe childhood neglect?: No Has patient ever been sexually abused/assaulted/raped as an adolescent or adult?: No Was the patient ever a victim of a crime or a disaster?: No Witnessed domestic violence?: No Has patient been affected by domestic violence as an adult?: No  Child/Adolescent Assessment:     CCA Substance Use Alcohol/Drug Use: Alcohol / Drug Use Pain Medications: see mar Prescriptions: see mar Over the Counter: see mar History of alcohol  / drug use?: No history of alcohol / drug abuse                         ASAM's:  Six Dimensions of Multidimensional Assessment  Dimension 1:  Acute Intoxication and/or Withdrawal Potential:      Dimension 2:  Biomedical Conditions and Complications:      Dimension 3:  Emotional, Behavioral, or Cognitive Conditions and Complications:     Dimension 4:  Readiness to Change:     Dimension 5:  Relapse, Continued use, or Continued Problem Potential:     Dimension 6:  Recovery/Living Environment:     ASAM Severity Score:    ASAM Recommended Level of Treatment:     Substance use Disorder (SUD)    Recommendations for Services/Supports/Treatments:    DSM5 Diagnoses:  Patient Active Problem List   Diagnosis Date Noted   Suicidal ideation 11/04/2023   Homicidal thoughts 11/04/2023   Menorrhagia with irregular cycle 02/06/2023   Anemia due to chronic blood loss 02/06/2023    Patient Centered Plan: Patient is on the following Treatment Plan(s):  Depression   Referrals to Alternative Service(s): Referred to Alternative Service(s):   Place:   Date:   Time:    Referred to Alternative Service(s):   Place:   Date:   Time:    Referred to Alternative Service(s):   Place:   Date:   Time:    Referred to Alternative Service(s):   Place:   Date:   Time:      @[facility]LL[facility]OFCARE @  [employer], LCAS-A

## 2023-11-04 NOTE — ED Triage Notes (Signed)
Patient states "for the past few nights when the sun goes down I have had suicidal thoughts. I want to kill my husband and animals first then myself. I thought about cutting them in the jugular." Patient reports having suicidal ideations in the past.

## 2023-11-04 NOTE — ED Notes (Signed)
VOL/Psych Consult Ordered/Pending

## 2023-11-04 NOTE — ED Notes (Addendum)
Pt belongings:  Blue bag with medications and slime for support and crochet  Wallet  Keys Plaid shirt Phone  2 rings 1 necklace Flip flops Grey pants 1 pair earrings  1 blue tank top Black underwear  1 hat White bra  Pt is aware that she can call during phone hours

## 2023-11-04 NOTE — Consult Note (Signed)
Telepsych Consultation   Reason for Consult:  Psych Evaluation Referring Physician:  Dr. Anner Crete  Location of Patient: [facility] ER Location of Provider: Behavioral Health TTS Department  Patient Identification: Robin Skinner MRN:  [id number] Principal Diagnosis: Suicidal ideation Diagnosis:  Principal Problem:   Suicidal ideation Active Problems:   Homicidal thoughts   Total Time spent with patient: 30 minutes  Subjective:   "Last night is thought about wanting to kill herself"  HPI:  Tele psych Assessment   Robin Skinner, 32 y.o., female patient seen via tele health by TTS and this provider; chart reviewed and consulted with Dr. Virgel Skinner on 11/04/23.  On evaluation [name]  states she has been having a tough time financially, and as a result been having morbid thoughts of wanting to kill her self, her husband and her dog. Patient states she is prescribed lexapro 15mg , reports that this dose was recently increased by 5mg , Buspar 7.5mg , and methylphenidate.  Says she has been on the medication for a year. Patient states that she still has feelings of SI. Reports poor sleep and reports increased appetite.  She still endorses HI.  She denies AH but hears voices of her conscious. Denies HI. Denies paranoia. Per triage note, patient states "for the past few nights when the sun goes down I have had suicidal thoughts. I want to kill my husband and animals first then myself. I thought about cutting them in the jugular." Patient reports having suicidal ideations in the past.  Per TTS, during assessment patient appears alert and oriented x4, calm and cooperative. Patient reports "I've been feeling worse the last couple of nights, I wanted to kill myself and I wanted to take my family with me." Patient reports this being her first HI thought in her life "I just don't want to exist." Patient reports some financial stress "I was working full time but got moved to [employer] and now I can't afford my bills."  Patient reports currently having a psychiatrist and is prescribed medication for ADHD, Depression and Anxiety but does not have a therapist. Patient also reports poor sleep and a increased appetite "I stress eat." When asked about current SI/HI patient is unable to contract for safety and reports that she does not feel safe being home right now. Patient denies AH/VH.   During evaluation [name] is sitting in the assessment chair she is alert/oriented x 4; anxious/ depressed/calm/cooperative; and mood congruent with affect.  Patient is speaking in a clear tone at moderate volume, and normal pace; with good eye contact.  The patient is at acute elevated risk of suicide/dangerousness to others and further worsening of psychiatric condition. Risk factors for suicide for this patient include: current diagnosis of depression, hopelessness, suicide thoughts leading to current admission, and suicide plan with accessible method. The patient DOES meet [facility] involuntary commitment criteria at this time.     Recommendations: Psychiatric inpatient hospitalization  Dr. Anner Crete informed of above recommendation and disposition   Past Psychiatric History: Depression and anxiety  Risk to Self:   Risk to Others:   Prior Inpatient Therapy:   Prior Outpatient Therapy:    Past Medical History:  Past Medical History:  Diagnosis Date   ADD (attention deficit disorder)    Anemia    Asthma    Bicuspid aortic valve    Depression    GERD (gastroesophageal reflux disease)    Hypothyroidism    Migraine    Shoulder impingement syndrome, right     Past  Surgical History:  Procedure Laterality Date   CYSTOSCOPY N/A 02/06/2023   Procedure: CYSTOSCOPY;  Surgeon: Conard Novak, MD;  Location: [facility] ORS;  Service: Gynecology;  Laterality: N/A;   ROBOTIC ASSISTED LAPAROSCOPIC HYSTERECTOMY AND SALPINGECTOMY Bilateral 02/06/2023   Procedure: XI ROBOTIC ASSISTED LAPAROSCOPIC HYSTERECTOMY AND SALPINGECTOMY;   Surgeon: Conard Novak, MD;  Location: [facility] ORS;  Service: Gynecology;  Laterality: Bilateral;   WISDOM TOOTH EXTRACTION     Family History:  Family History  Problem Relation Age of Onset   Heart disease Maternal Grandmother    Hypertension Maternal Grandmother    Stroke Maternal Grandmother    Diabetes Paternal Grandmother    Family Psychiatric  History: unknown Social History:  Social History   Substance and Sexual Activity  Alcohol Use Yes   Comment: occasionally     Social History   Substance and Sexual Activity  Drug Use No   Comment: cbd    Social History   Socioeconomic History   Marital status: Married    Spouse name: Not on file   Number of children: Not on file   Years of education: Not on file   Highest education level: Not on file  Occupational History   Not on file  Tobacco Use   Smoking status: Never   Smokeless tobacco: Never  Vaping Use   Vaping status: Never Used  Substance and Sexual Activity   Alcohol use: Yes    Comment: occasionally   Drug use: No    Comment: cbd   Sexual activity: Not on file  Other Topics Concern   Not on file  Social History Narrative   Not on file   Social Determinants of Health   Financial Resource Strain: Medium Risk (01/11/2023)   Received from [facility], [facility]   Overall Financial Resource Strain (CARDIA)    Difficulty of Paying Living Expenses: Somewhat hard  Food Insecurity: Food Insecurity Present (01/11/2023)   Received from [facility], [facility]   Hunger Vital Sign    Worried About Running Out of Food in the Last Year: Sometimes true    Ran Out of Food in the Last Year: Never true  Transportation Needs: No Transportation Needs (01/11/2023)   Received from [facility], [facility]   PRAPARE - Transportation    In the past 12 months, has lack of transportation kept you from medical  appointments or from getting medications?: No    Lack of Transportation (Non-Medical): No  Physical Activity: Insufficiently Active (01/11/2023)   Received from [facility], [facility]   Exercise Vital Sign    Days of Exercise per Week: 3 days    Minutes of Exercise per Session: 30 min  Stress: Stress Concern Present (01/11/2023)   Received from [facility], [facility]   [employer] of Occupational Health - Occupational Stress Questionnaire    Feeling of Stress : To some extent  Social Connections: Moderately Isolated (01/11/2023)   Received from [facility], [facility]   Social Connection and Isolation Panel [NHANES]    Frequency of Communication with Friends and Family: Once a week    Frequency of Social Gatherings with Friends and Family: Once a week    Attends Religious Services: 1 to 4 times per year    Active Member of [employer] or Organizations: No    Attends Banker Meetings:  Never    Marital Status: Married   Additional Social History:    Allergies:  No Known Allergies  Labs:  Results for orders placed or performed during the hospital encounter of 11/04/23 (from the past 48 hour(s))  Comprehensive metabolic panel     Status: Abnormal   Collection Time: 11/04/23  3:42 PM  Result Value Ref Range   Sodium 137 135 - 145 mmol/L   Potassium 4.0 3.5 - 5.1 mmol/L   Chloride 104 98 - 111 mmol/L   CO2 25 22 - 32 mmol/L   Glucose, Bld 107 (H) 70 - 99 mg/dL    Comment: Glucose reference range applies only to samples taken after fasting for at least 8 hours.   BUN 7 6 - 20 mg/dL   Creatinine, Ser 1.61 0.44 - 1.00 mg/dL   Calcium 8.9 8.9 - 09.6 mg/dL   Total Protein 8.4 (H) 6.5 - 8.1 g/dL   Albumin 4.2 3.5 - 5.0 g/dL   AST 13 (L) 15 - 41 U/L   ALT 9 0 - 44 U/L   Alkaline Phosphatase 100 38 - 126 U/L   Total Bilirubin 0.2 <1.2 mg/dL   GFR, Estimated >04  >54 mL/min    Comment: (NOTE) Calculated using the CKD-EPI Creatinine Equation (2021)    Anion gap 8 5 - 15    Comment: Performed at [facility], [address] [place], [place] [address]  Ethanol     Status: None   Collection Time: 11/04/23  3:42 PM  Result Value Ref Range   Alcohol, Ethyl (B) <10 <10 mg/dL    Comment: (NOTE) Lowest detectable limit for serum alcohol is 10 mg/dL.  For medical purposes only. Performed at [facility], [address] [place], [place] [address]   Salicylate level     Status: Abnormal   Collection Time: 11/04/23  3:42 PM  Result Value Ref Range   Salicylate Lvl <7.0 (L) 7.0 - 30.0 mg/dL    Comment: Performed at [facility], [address] [place], [place] [address]  Acetaminophen level     Status: Abnormal   Collection Time: 11/04/23  3:42 PM  Result Value Ref Range   Acetaminophen (Tylenol), Serum <10 (L) 10 - 30 ug/mL    Comment: (NOTE) Therapeutic concentrations vary significantly. A range of 10-30 ug/mL  may be an effective concentration for many patients. However, some  are best treated at concentrations outside of this range. Acetaminophen concentrations >150 ug/mL at 4 hours after ingestion  and >50 ug/mL at 12 hours after ingestion are often associated with  toxic reactions.  Performed at [facility], [address] [place], [place] [address]   cbc     Status: Abnormal   Collection Time: 11/04/23  3:42 PM  Result Value Ref Range   WBC 6.5 4.0 - 10.5 K/uL   RBC 4.67 3.87 - 5.11 MIL/uL   Hemoglobin 12.0 12.0 - 15.0 g/dL   HCT 57.8 46.9 - 62.9 %   MCV 82.0 80.0 - 100.0 fL   MCH 25.7 (L) 26.0 - 34.0 pg   MCHC 31.3 30.0 - 36.0 g/dL   RDW 52.8 (H) 41.3 - 24.4 %   Platelets 270 150 - 400 K/uL   nRBC 0.0 0.0 - 0.2 %    Comment: Performed at [facility], [address] [place], [place] [address]  Urine Drug Screen, Qualitative     Status: Abnormal   Collection Time: 11/04/23   3:42 PM  Result Value Ref Range   Tricyclic, Ur Screen NONE DETECTED NONE DETECTED   Amphetamines, Ur Screen POSITIVE (A) NONE DETECTED   MDMA (Ecstasy)Ur Screen NONE DETECTED NONE DETECTED   Cocaine Metabolite,Ur [place] NONE DETECTED NONE DETECTED   Opiate, Ur Screen NONE DETECTED NONE DETECTED   Phencyclidine (PCP) Ur S NONE DETECTED NONE DETECTED   Cannabinoid 50 Ng, Ur [place] POSITIVE (A) NONE DETECTED   Barbiturates, Ur Screen NONE DETECTED NONE DETECTED   Benzodiazepine, Ur Scrn NONE DETECTED NONE DETECTED   Methadone Scn, Ur NONE DETECTED NONE DETECTED    Comment: (NOTE) Tricyclics + metabolites, urine    Cutoff 1000 ng/mL Amphetamines + metabolites, urine  Cutoff 1000 ng/mL MDMA (Ecstasy), urine              Cutoff 500 ng/mL Cocaine Metabolite, urine          Cutoff 300 ng/mL Opiate + metabolites, urine        Cutoff 300 ng/mL Phencyclidine (PCP), urine         Cutoff 25 ng/mL Cannabinoid, urine                 Cutoff 50 ng/mL Barbiturates + metabolites, urine  Cutoff 200 ng/mL Benzodiazepine, urine              Cutoff 200 ng/mL Methadone, urine                   Cutoff 300 ng/mL  The urine drug screen provides only a preliminary, unconfirmed analytical test result and should not be used for non-medical purposes. Clinical consideration and professional judgment should be applied to any positive drug screen result due to possible interfering substances. A more specific alternate chemical method must be used in order to obtain a confirmed analytical result. Gas chromatography / mass spectrometry (GC/MS) is the preferred confirm atory method. Performed at [facility], [address] [place], [place] [address]   POC Urine Pregnancy, ED     Status: None   Collection Time: 11/04/23  3:54 PM  Result Value Ref Range   Preg Test, Ur Negative Negative    Medications:  No current facility-administered medications for this encounter.   Current Outpatient Medications   Medication Sig Dispense Refill   albuterol (VENTOLIN HFA) 108 (90 Base) MCG/ACT inhaler Inhale 2 puffs into the lungs every 4 (four) hours as needed for wheezing or shortness of breath.     ascorbic acid (VITAMIN C) 500 MG tablet Take 500 mg by mouth 2 (two) times daily.     buPROPion (WELLBUTRIN XL) 150 MG 24 hr tablet Take 150 mg by mouth daily.     busPIRone (BUSPAR) 7.5 MG tablet Take 7.5 mg by mouth 3 (three) times daily.     Cholecalciferol (VITAMIN D-3 PO) Take 2,000 Units by mouth daily.     cyanocobalamin (VITAMIN B12) 500 MCG tablet Take 1,000 mcg by mouth daily.     dexmethylphenidate (FOCALIN XR) 10 MG 24 hr capsule Take 10 mg by mouth in the morning.     dexmethylphenidate (FOCALIN) 10 MG tablet Take 10 mg by mouth at bedtime as needed.     escitalopram (LEXAPRO) 10 MG tablet Take 10 mg by mouth daily.     ibuprofen (ADVIL) 600 MG tablet Take 1 tablet (600 mg total) by mouth every 6 (six) hours. 30 tablet 0   levothyroxine (SYNTHROID) 50 MCG tablet Take 50 mcg by mouth daily before breakfast.     linaclotide Karlene Einstein)  290 MCG CAPS capsule Take 290 mcg by mouth daily.     magnesium oxide (MAG-OX) 400 (240 Mg) MG tablet Take 250 mg by mouth 2 (two) times daily.     meclizine (ANTIVERT) 25 MG tablet Take 1 tablet (25 mg total) by mouth 3 (three) times daily as needed for dizziness. 15 tablet 0   omeprazole (PRILOSEC) 40 MG capsule Take 40 mg by mouth daily.     ondansetron (ZOFRAN ODT) 4 MG disintegrating tablet Take 1 tablet (4 mg total) by mouth every 8 (eight) hours as needed. 15 tablet 0   ondansetron (ZOFRAN-ODT) 4 MG disintegrating tablet Take 1 tablet (4 mg total) by mouth every 6 (six) hours as needed for nausea. 20 tablet 0   pregabalin (LYRICA) 50 MG capsule Take 50 mg by mouth 2 (two) times daily.     rOPINIRole (REQUIP) 0.5 MG tablet Take 0.5 mg by mouth at bedtime.     saccharomyces boulardii (FLORASTOR) 250 MG capsule Take 250 mg by mouth 2 (two) times daily.       Musculoskeletal: Strength & Muscle Tone: within normal limits Gait & Station: normal Patient leans: N/A  Psychiatric Specialty Exam:  Presentation  General Appearance: No data recorded Eye Contact:No data recorded Speech:No data recorded Speech Volume:No data recorded Handedness:No data recorded  Mood and Affect  Mood:No data recorded Affect:No data recorded  Thought Process  Thought Processes:No data recorded Descriptions of Associations:No data recorded Orientation:No data recorded Thought Content:No data recorded History of Schizophrenia/Schizoaffective disorder:No data recorded Duration of Psychotic Symptoms:No data recorded Hallucinations:No data recorded Ideas of Reference:No data recorded Suicidal Thoughts:No data recorded Homicidal Thoughts:No data recorded  Sensorium  Memory:No data recorded Judgment:No data recorded Insight:No data recorded  Executive Functions  Concentration:No data recorded Attention Span:No data recorded Recall:No data recorded Fund of Knowledge:No data recorded Language:No data recorded  Psychomotor Activity  Psychomotor Activity:No data recorded  Assets  Assets:No data recorded  Sleep  Sleep:No data recorded   Physical Exam: Physical Exam Vitals and nursing note reviewed.  Constitutional:      Appearance: Normal appearance.  HENT:     Head: Normocephalic and atraumatic.     Nose: Nose normal.     Mouth/Throat:     Mouth: Mucous membranes are dry.  Eyes:     Pupils: Pupils are equal, round, and reactive to light.  Pulmonary:     Breath sounds: Normal breath sounds.  Musculoskeletal:        General: Normal range of motion.     Cervical back: Normal range of motion.  Skin:    General: Skin is dry.  Neurological:     Mental Status: She is alert and oriented to person, place, and time.  Psychiatric:        Attention and Perception: Attention and perception normal.        Mood and Affect: Mood is depressed.  Affect is flat and tearful.        Speech: Speech normal.        Behavior: Behavior is cooperative.        Thought Content: Thought content does not include homicidal or suicidal ideation. Thought content does not include homicidal or suicidal plan.        Cognition and Memory: Memory normal. Cognition is impaired.        Judgment: Judgment is impulsive.    ROS Blood pressure 122/75, pulse 85, temperature 98.6 F (37 C), temperature source Oral, resp. rate 16, height 5\' 9"  (1.753  m), weight 122.5 kg, last menstrual period 04/06/2018, SpO2 100%. Body mass index is 39.87 kg/m.  Treatment Plan Summary: Daily contact with patient to assess and evaluate symptoms and progress in treatment, Medication management, and Plan  Kryslyn Helbig was admitted to [facility] ER for Suicidal ideation, crisis management, and stabilization. Routine labs ordered, which include  Lab Orders         Comprehensive metabolic panel         Ethanol         Salicylate level         Acetaminophen level         cbc         Urine Drug Screen, Qualitative         POC Urine Pregnancy, ED    Medication Management: Medications started  Will maintain observation checks every 15 minutes for safety. Psychosocial education regarding relapse prevention and self-care; social and communication  Social work will consult with family for collateral information and discuss discharge and follow up plan.  Disposition: Recommend psychiatric Inpatient admission when medically cleared. Supportive therapy provided about ongoing stressors. Discussed crisis plan, support from social network, calling 911, coming to the Emergency Department, and calling Suicide Hotline.  This service was provided via telemedicine using a 2-way, interactive audio and video technology.  Jearld Lesch, NP 11/04/2023 8:38 PM

## 2023-11-05 ENCOUNTER — Inpatient Hospital Stay
Admission: AD | Admit: 2023-11-05 | Discharge: 2023-11-12 | DRG: 881 | Disposition: A | Discharge status: Home / Self Care | Payer: 59 | Source: Intra-hospital | Attending: Psychiatry | Admitting: Psychiatry

## 2023-11-05 ENCOUNTER — Encounter: Payer: Self-pay | Admitting: Psychiatric/Mental Health

## 2023-11-05 DIAGNOSIS — Z8249 Family history of ischemic heart disease and other diseases of the circulatory system: Secondary | ICD-10-CM

## 2023-11-05 DIAGNOSIS — R45851 Suicidal ideations: Secondary | ICD-10-CM | POA: Diagnosis present

## 2023-11-05 DIAGNOSIS — Z5941 Food insecurity: Secondary | ICD-10-CM | POA: Diagnosis not present

## 2023-11-05 DIAGNOSIS — Z5986 Financial insecurity: Secondary | ICD-10-CM | POA: Diagnosis not present

## 2023-11-05 DIAGNOSIS — Q2381 Bicuspid aortic valve: Secondary | ICD-10-CM

## 2023-11-05 DIAGNOSIS — Z6838 Body mass index (BMI) 38.0-38.9, adult: Secondary | ICD-10-CM

## 2023-11-05 DIAGNOSIS — G2581 Restless legs syndrome: Secondary | ICD-10-CM | POA: Diagnosis present

## 2023-11-05 DIAGNOSIS — Z79899 Other long term (current) drug therapy: Secondary | ICD-10-CM

## 2023-11-05 DIAGNOSIS — R45850 Homicidal ideations: Secondary | ICD-10-CM | POA: Diagnosis present

## 2023-11-05 DIAGNOSIS — Z90710 Acquired absence of both cervix and uterus: Secondary | ICD-10-CM | POA: Diagnosis not present

## 2023-11-05 DIAGNOSIS — Z90722 Acquired absence of ovaries, bilateral: Secondary | ICD-10-CM

## 2023-11-05 DIAGNOSIS — F333 Major depressive disorder, recurrent, severe with psychotic symptoms: Secondary | ICD-10-CM | POA: Diagnosis not present

## 2023-11-05 DIAGNOSIS — K219 Gastro-esophageal reflux disease without esophagitis: Secondary | ICD-10-CM | POA: Diagnosis present

## 2023-11-05 DIAGNOSIS — F419 Anxiety disorder, unspecified: Secondary | ICD-10-CM | POA: Diagnosis present

## 2023-11-05 DIAGNOSIS — F329 Major depressive disorder, single episode, unspecified: Principal | ICD-10-CM | POA: Diagnosis present

## 2023-11-05 DIAGNOSIS — F909 Attention-deficit hyperactivity disorder, unspecified type: Secondary | ICD-10-CM | POA: Diagnosis present

## 2023-11-05 DIAGNOSIS — E66812 Obesity, class 2: Secondary | ICD-10-CM | POA: Diagnosis present

## 2023-11-05 DIAGNOSIS — E039 Hypothyroidism, unspecified: Secondary | ICD-10-CM | POA: Diagnosis present

## 2023-11-05 DIAGNOSIS — F324 Major depressive disorder, single episode, in partial remission: Secondary | ICD-10-CM | POA: Diagnosis not present

## 2023-11-05 DIAGNOSIS — Z9079 Acquired absence of other genital organ(s): Secondary | ICD-10-CM

## 2023-11-05 DIAGNOSIS — Z79890 Hormone replacement therapy (postmenopausal): Secondary | ICD-10-CM | POA: Diagnosis not present

## 2023-11-05 MED ORDER — HALOPERIDOL 5 MG PO TABS
5.0000 mg | ORAL_TABLET | Freq: Three times a day (TID) | ORAL | Status: DC | PRN
Start: 2023-11-05 — End: 2023-11-06

## 2023-11-05 MED ORDER — MAGNESIUM HYDROXIDE 400 MG/5ML PO SUSP: 30.0000 mL | Freq: Every day | ORAL | Status: DC | PRN

## 2023-11-05 MED ORDER — HYDROXYZINE HCL 25 MG PO TABS: 25.0000 mg | ORAL_TABLET | Freq: Three times a day (TID) | ORAL | Status: DC | PRN

## 2023-11-05 MED ORDER — TRAZODONE HCL 50 MG PO TABS
50.0000 mg | ORAL_TABLET | Freq: Every evening | ORAL | Status: DC | PRN
  Administered 2023-11-05 – 2023-11-08 (×2): 50 mg via ORAL
  Filled 2023-11-05 (×2): qty 1

## 2023-11-05 MED ORDER — DIPHENHYDRAMINE HCL 25 MG PO CAPS: 50.0000 mg | ORAL_CAPSULE | Freq: Three times a day (TID) | ORAL | Status: DC | PRN

## 2023-11-05 MED ORDER — LORAZEPAM 2 MG/ML IJ SOLN: 2.0000 mg | Freq: Three times a day (TID) | INTRAMUSCULAR | Status: DC | PRN

## 2023-11-05 MED ORDER — LORAZEPAM 2 MG PO TABS: 2.0000 mg | ORAL_TABLET | Freq: Three times a day (TID) | ORAL | Status: DC | PRN

## 2023-11-05 MED ORDER — ACETAMINOPHEN 325 MG PO TABS
650.0000 mg | ORAL_TABLET | Freq: Four times a day (QID) | ORAL | Status: DC | PRN
  Administered 2023-11-08 – 2023-11-12 (×7): 650 mg via ORAL
  Filled 2023-11-05 (×7): qty 2

## 2023-11-05 MED ORDER — DIPHENHYDRAMINE HCL 50 MG/ML IJ SOLN: 50.0000 mg | Freq: Three times a day (TID) | INTRAMUSCULAR | Status: DC | PRN

## 2023-11-05 MED ORDER — HALOPERIDOL LACTATE 5 MG/ML IJ SOLN: 5.0000 mg | Freq: Three times a day (TID) | INTRAMUSCULAR | Status: DC | PRN

## 2023-11-05 MED ORDER — ALUM & MAG HYDROXIDE-SIMETH 200-200-20 MG/5ML PO SUSP
30.0000 mL | ORAL | Status: DC | PRN
  Administered 2023-11-06: 30 mL via ORAL
  Filled 2023-11-05: qty 30

## 2023-11-05 NOTE — ED Notes (Signed)
100% of food consumed. Pt tolerated with no complaints. Trash disposed of appropriately.

## 2023-11-05 NOTE — Plan of Care (Signed)
  Problem: Education: Goal: Verbalization of understanding the information provided will improve Outcome: Progressing   Problem: Activity: Goal: Interest or engagement in activities will improve Outcome: Progressing   Problem: Safety: Goal: Periods of time without injury will increase Outcome: Progressing

## 2023-11-05 NOTE — ED Notes (Addendum)
Called [facility] as pt has been accepted 11/04/23 @ 0241 for bed number.  Message left with [facility] for charge to call back.

## 2023-11-05 NOTE — ED Notes (Signed)
Voluntary /recommend psych inpatient admission when medically cleared

## 2023-11-05 NOTE — ED Notes (Signed)
Pt provided with breakfast tray. Pt sitting up in bed eating.

## 2023-11-05 NOTE — OR Nursing (Addendum)
D- Patient alert and oriented.Denies SI, HI, AVH. Patient received trazodone for sleep.  A- Scheduled medications administered to patient, per MD orders. Support and encouragement provided.  Routine safety checks conducted every 15 minutes.  Patient informed to notify staff with problems or concerns. R- No adverse drug reactions noted. Patient contracts for safety at this time. Patient compliant with medications and treatment plan. Patient receptive, calm, and cooperative. Patient interacts well with others on the unit.  Patient remains safe at this time.

## 2023-11-05 NOTE — Progress Notes (Signed)
Robin Skinner is a 32 year old female admitted voluntarily from [facility] ED due to intrusive thoughts of killing her pets, husband and herself.     Patient presents anxious and worried but cooperative and with appropriate speech and behavior. 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 Patient states they also have a roommate who they wish to kick out as she is emotionally manipulative but she pays enough for utilities and food. Patient states she has been depressed "for a long time" but her intrusive thoughts started Sunday night. Patient states she has thoughts of how "easy it is to kill them" when referring to her pets and has thoughts of "strangling them" even though " I don't want to kill them." Patient states she also has thoughts of slitting her husbands throat and then killing herself. Patient states while she is at work she looks at the scalpels and thinks "I could just slice my throat right now." Patient states her main triggers are feeling like she is going to lose her home due to current financial issues and does not like to ask her family for help. Patient states these thoughts might arise due to not wanting her husband and pets to be alone and live without her. Patient states she receives medication management through [facility] but sees no outpatient psychiatrist or has anyone to talk to. Patient denies any previous psychiatric admissions.   Patient oriented to unit/unit rules and provided with meal. Patient verbalized all understanding and denies active SI,HI, and A/V/H at this moment although claims the thoughts come and go. Patient cooperative and in no current distress.

## 2023-11-05 NOTE — Group Note (Signed)
Date:  11/05/2023 Time:  9:56 PM  Group Topic/Focus:  Overcoming Stress:   The focus of this group is to define stress and help patients assess their triggers.    Participation Level:  Active  Participation Quality:  Appropriate and Attentive  Affect:  Appropriate  Cognitive:  Alert and Appropriate  Insight: Appropriate, Good, and Improving  Engagement in Group:  Developing/Improving and Engaged  Modes of Intervention:  Discussion, Education, Rapport Building, Socialization, and Support  Additional Comments:     Hattye Siegfried 11/05/2023, 9:56 PM

## 2023-11-05 NOTE — ED Provider Notes (Signed)
Emergency Medicine Observation Re-evaluation Note  Robin Skinner is a 32 y.o. female, seen on rounds today.  Pt initially presented to the ED for complaints of Suicidal  Currently, the patient is is no acute distress. Denies any concerns at this time.  Physical Exam  Blood pressure 122/75, pulse 85, temperature 98.6 F (37 C), temperature source Oral, resp. rate 16, height 5\' 9"  (1.753 m), weight 122.5 kg, last menstrual period 04/06/2018, SpO2 100%.  Physical Exam: General: No apparent distress Pulm: Normal WOB Neuro: Moving all extremities Psych: Resting comfortably     ED Course / MDM     I have reviewed the labs performed to date as well as medications administered while in observation.  Recent changes in the last 24 hours include: No acute events overnight.  Plan   Current plan: Patient awaiting psychiatric disposition. Patient is not under full IVC at this time.    Rashidi Loh, Layla Maw, DO 11/05/23 [phone]

## 2023-11-05 NOTE — ED Notes (Signed)
Pt is A/Ox 4, Robin Robin Skinner endorses intermittent SI as well as HI towards her husband and pets.  She stated that she does have auditory  hallucinations periodically as well.  Impatient stay discussed with Robin Skinner, she is agreeable and they verbalized understanding.  All Belongings accounted for and sent with patient to [facility]U for inpatient admission.

## 2023-11-05 NOTE — Group Note (Signed)
Date:  11/05/2023 Time:  11:21 AM  Group Topic/Focus:  Coping With Mental Health Crisis:   The purpose of this group is to help patients identify strategies for coping with mental health crisis.  Group discusses possible causes of crisis and ways to manage them effectively. Goals Group:   The focus of this group is to help patients establish daily goals to achieve during treatment and discuss how the patient can incorporate goal setting into their daily lives to aide in recovery. Rediscovering Joy:   The focus of this group is to explore various ways to relieve stress in a positive manner.    Participation Level:  Did Not Attend   Gentle Hoge 11/05/2023, 11:21 AM

## 2023-11-05 NOTE — ED Notes (Signed)
Attempted to call to give report, receive bed information.  No answer in [facility] department.

## 2023-11-05 NOTE — Group Note (Signed)
Recreation Therapy Group Note   Group Topic:Health and Wellness  Group Date: 11/05/2023 Start Time: 1000 End Time: 1055 Facilitators: Rosina Lowenstein, LRT, CTRS Location: Courtyard  Group Description: [employer]. LRT and patients played games of basketball, drew with chalk, and played corn hole while outside in the courtyard while getting fresh air and sunlight. Music was being played in the background. LRT and peers conversed about different games they have played before, what they do in their free time and anything else that is on their minds. LRT encouraged pts to drink water after being outside, sweating and getting their heart rate up.  Goal Area(s) Addressed: Patient will build on frustration tolerance skills. Patients will partake in a competitive play game with peers. Patients will gain knowledge of new leisure interest/hobby.    Affect/Mood: N/A   Participation Level: Did not attend    Clinical Observations/Individualized Feedback: Robin Skinner did not attend group.   Plan: Continue to engage patient in RT group sessions 2-3x/week.   Rosina Lowenstein, LRT, CTRS 11/05/2023 11:43 AM

## 2023-11-05 NOTE — Plan of Care (Addendum)
Patient has not had time to progress.  Problem: Education: Goal: Knowledge of [facility] Education information/materials will improve Outcome: Not Progressing Goal: Emotional status will improve Outcome: Not Progressing Goal: Mental status will improve Outcome: Not Progressing Goal: Verbalization of understanding the information provided will improve Outcome: Not Progressing   Problem: Activity: Goal: Interest or engagement in activities will improve Outcome: Not Progressing Goal: Sleeping patterns will improve Outcome: Not Progressing

## 2023-11-05 NOTE — ED Notes (Addendum)
Called [facility] again to give report / receive bed number no answer

## 2023-11-05 NOTE — Tx Team (Signed)
Initial Treatment Plan 11/05/2023 11:39 AM [name] ZOX:[id number]    PATIENT STRESSORS: Financial difficulties     PATIENT STRENGTHS: General fund of knowledge  Supportive family/friends  Work skills    PATIENT IDENTIFIED PROBLEMS: Depression  SI/HI ideation (intrusive thoughts)  Financial difficulties                 DISCHARGE CRITERIA:  Improved stabilization in mood, thinking, and/or behavior Verbal commitment to aftercare and medication compliance  PRELIMINARY DISCHARGE PLAN: Outpatient therapy Return to previous living arrangement  PATIENT/FAMILY INVOLVEMENT: This treatment plan has been presented to and reviewed with the patient, Robin Skinner. The patient has been given the opportunity to ask questions and make suggestions.  Roseanne Reno, RN 11/05/2023, 11:39 AM

## 2023-11-06 DIAGNOSIS — F333 Major depressive disorder, recurrent, severe with psychotic symptoms: Secondary | ICD-10-CM

## 2023-11-06 LAB — URINE DRUG SCREEN, QUALITATIVE (ARMC ONLY)
Amphetamines, Ur Screen: NOT DETECTED
Barbiturates, Ur Screen: NOT DETECTED
Benzodiazepine, Ur Scrn: NOT DETECTED
Cannabinoid 50 Ng, Ur ~~LOC~~: NOT DETECTED
Cocaine Metabolite,Ur ~~LOC~~: NOT DETECTED
MDMA (Ecstasy)Ur Screen: NOT DETECTED
Methadone Scn, Ur: NOT DETECTED
Opiate, Ur Screen: NOT DETECTED
Phencyclidine (PCP) Ur S: NOT DETECTED
Tricyclic, Ur Screen: NOT DETECTED

## 2023-11-06 MED ORDER — LEVOTHYROXINE SODIUM 50 MCG PO TABS
50.0000 ug | ORAL_TABLET | Freq: Every day | ORAL | Status: DC
  Administered 2023-11-07 – 2023-11-12 (×6): 50 ug via ORAL
  Filled 2023-11-06 (×6): qty 1

## 2023-11-06 MED ORDER — LORAZEPAM 1 MG PO TABS
1.0000 mg | ORAL_TABLET | ORAL | Status: DC | PRN
  Administered 2023-11-08 – 2023-11-09 (×2): 1 mg via ORAL
  Filled 2023-11-06 (×2): qty 1

## 2023-11-06 MED ORDER — FLUOXETINE HCL 20 MG PO CAPS
20.0000 mg | ORAL_CAPSULE | Freq: Every day | ORAL | Status: DC
  Administered 2023-11-06 – 2023-11-12 (×7): 20 mg via ORAL
  Filled 2023-11-06 (×7): qty 1

## 2023-11-06 MED ORDER — PRENATAL MULTIVITAMIN CH
1.0000 | ORAL_TABLET | Freq: Every day | ORAL | Status: DC
  Administered 2023-11-06 – 2023-11-12 (×7): 1 via ORAL
  Filled 2023-11-06 (×7): qty 1

## 2023-11-06 MED ORDER — BUPROPION HCL ER (XL) 150 MG PO TB24
150.0000 mg | ORAL_TABLET | Freq: Every day | ORAL | Status: DC
  Administered 2023-11-06 – 2023-11-12 (×7): 150 mg via ORAL
  Filled 2023-11-06 (×7): qty 1

## 2023-11-06 MED ORDER — DEXMETHYLPHENIDATE HCL ER 5 MG PO CP24
10.0000 mg | ORAL_CAPSULE | Freq: Every day | ORAL | Status: DC
  Administered 2023-11-06 – 2023-11-12 (×7): 10 mg via ORAL
  Filled 2023-11-06 (×7): qty 2

## 2023-11-06 MED ORDER — MONTELUKAST SODIUM 10 MG PO TABS
10.0000 mg | ORAL_TABLET | Freq: Every day | ORAL | Status: DC
  Administered 2023-11-06 – 2023-11-11 (×6): 10 mg via ORAL
  Filled 2023-11-06 (×6): qty 1

## 2023-11-06 MED ORDER — OLANZAPINE 10 MG PO TABS: 10.0000 mg | ORAL_TABLET | Freq: Four times a day (QID) | ORAL | Status: DC | PRN

## 2023-11-06 MED ORDER — ONDANSETRON 4 MG PO TBDP: 4.0000 mg | ORAL_TABLET | Freq: Three times a day (TID) | ORAL | Status: DC | PRN

## 2023-11-06 MED ORDER — IBUPROFEN 600 MG PO TABS: 600.0000 mg | ORAL_TABLET | Freq: Four times a day (QID) | ORAL | Status: DC | PRN

## 2023-11-06 MED ORDER — PREGABALIN 50 MG PO CAPS
50.0000 mg | ORAL_CAPSULE | Freq: Two times a day (BID) | ORAL | Status: DC
  Administered 2023-11-06 – 2023-11-12 (×12): 50 mg via ORAL
  Filled 2023-11-06 (×12): qty 1

## 2023-11-06 MED ORDER — RISPERIDONE 1 MG PO TABS
0.5000 mg | ORAL_TABLET | ORAL | Status: DC
  Administered 2023-11-06 – 2023-11-08 (×4): 0.5 mg via ORAL
  Filled 2023-11-06 (×5): qty 1

## 2023-11-06 MED ORDER — LINACLOTIDE 145 MCG PO CAPS
290.0000 ug | ORAL_CAPSULE | ORAL | Status: DC
  Administered 2023-11-06 – 2023-11-12 (×4): 290 ug via ORAL
  Filled 2023-11-06 (×5): qty 2

## 2023-11-06 MED ORDER — PANTOPRAZOLE SODIUM 40 MG PO TBEC
40.0000 mg | DELAYED_RELEASE_TABLET | Freq: Every day | ORAL | Status: DC
  Administered 2023-11-06 – 2023-11-12 (×7): 40 mg via ORAL
  Filled 2023-11-06 (×7): qty 1

## 2023-11-06 NOTE — Group Note (Signed)
Recreation Therapy Group Note   Group Topic:Goal Setting  Group Date: 11/06/2023 Start Time: 1015 End Time: 1115 Facilitators: Rosina Lowenstein, LRT, CTRS Location:  Dayroom  Group Description: Vision Boards. Patients were given many different magazines, a glue stick, markers, and a piece of cardstock paper. LRT and pts discussed the importance of having goals in life. LRT and pts discussed the difference between short-term and long-term goals, as well as what a SMART goal is. LRT encouraged pts to create a vision board, with images they picked and then cut out with safety scissors from the magazine, for themselves, that capture their short and long-term goals. LRT encouraged pts to show and explain their vision board to the group.   Goal Area(s) Addressed:  Patient will gain knowledge of short vs. long term goals.  Patient will identify goals for themselves. Patient will practice setting SMART goals. Patient will verbalize their goals to LRT and peers.   Affect/Mood: Appropriate and Blunted   Participation Level: Active and Engaged   Participation Quality: Independent   Behavior: Calm and Cooperative   Speech/Thought Process: Coherent   Insight: Good   Judgement: Good   Modes of Intervention: Art   Patient Response to Interventions:  Attentive, Engaged, Interested , and Receptive   Education Outcome:  Acknowledges education   Clinical Observations/Individualized Feedback: Wille Glaser was active in their participation of session activities and group discussion. Pt identified "complete house projects, eat my husbands cooking, get my finances under control" as her goals. Pt interacted well with LRT and peers duration of session.    Plan: Continue to engage patient in RT group sessions 2-3x/week.   Rosina Lowenstein, LRT, CTRS 11/06/2023 11:32 AM

## 2023-11-06 NOTE — Progress Notes (Signed)
Nutrition Brief Note  Patient identified on the Malnutrition Screening Tool (MST) Report  Wt Readings from Last 15 Encounters:  11/05/23 119.7 kg  11/04/23 122.5 kg  02/06/23 117.9 kg  01/28/23 121.1 kg  04/10/18 100.2 kg  01/21/18 102.1 kg  01/13/18 98.9 kg  08/18/17 81.6 kg    Body mass index is 38.96 kg/m. Patient meets criteria for obesity, class II based on current BMI. Obesity is a complex, chronic medical condition that is optimally managed by a multidisciplinary care team. Weight loss is not an ideal goal for an acute inpatient hospitalization. However, if further work-up for obesity is warranted, consider outpatient referral to [facility].    Current diet order is regular, patient is consuming approximately n/a% of meals at this time. Labs and medications reviewed.   No nutrition interventions warranted at this time. If nutrition issues arise, please consult RD.   Levada Schilling, RD, LDN, CDCES Registered Dietitian III Certified Diabetes Care and Education Specialist Please refer to [facility] for RD and/or RD on-call/weekend/after hours pager
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
## 2023-11-06 NOTE — H&P (Signed)
Psychiatric Admission Assessment Adult  Patient Identification: Robin Skinner MRN:  [id number] Date of Evaluation:  11/06/2023 Chief Complaint:  MDD (major depressive disorder) [F32.9] Principal Diagnosis: MDD (major depressive disorder) Diagnosis:  Principal Problem:   MDD (major depressive disorder)  History of Present Illness: Robin Skinner is a 32 year old white female who was voluntarily admitted to inpatient psychiatry for worsening depression and suicidal ideation.  She has been having a lot of financial problems and her husband is currently on disability.  She went from full-time to part-time at her work and is having trouble paying the bills.  She endorses anhedonia, depressed mood, anxiety, difficulty sleeping, suicidal ideation.  She also states that she has a roommate to help pay the bills but she has been more of a problem than help.  She sees a Therapist, [facility] at [facility] through telepsych.  We talked about Lexapro which is what she has been on and she says it has not been helpful and she is willing to change it.  She says that she was having thoughts of killing herself and everyone else in the house.  That is why she came to the emergency room. Associated Signs/Symptoms: Depression Symptoms:  depressed mood, anhedonia, insomnia, hopelessness, suicidal thoughts without plan, anxiety, (Hypo) Manic Symptoms:   No Anxiety Symptoms:  Excessive Worry, Panic Symptoms, Social Anxiety, Psychotic Symptoms:   She describes herself as having out of body experiences. PTSD Symptoms: NA Total Time spent with patient: 1 hour  Past Psychiatric History: As above  Is the patient at risk to self? Yes.    Has the patient been a risk to self in the past 6 months? Yes.    Has the patient been a risk to self within the distant past? Yes.    Is the patient a risk to others? Yes.    Has the patient been a risk to others in the past 6 months? Yes.    Has the patient been a risk to others  within the distant past? Yes.     [place] Scale:  Flowsheet Row Admission (Current) from 11/05/2023 in [facility] INPATIENT BEHAV[facility]AL MEDICINE ED from 11/04/2023 in [facility] Emergency Department at [facility] Admission (Discharged) from 02/06/2023 in [facility] AREA  C-SSRS RISK CATEGORY Moderate Risk High Risk No Risk        Prior Inpatient Therapy: No. If yes, describe  Prior Outpatient Therapy: Yes.   If yes, describe as above  Alcohol Screening: 1. How often do you have a drink containing alcohol?: Monthly or less 2. How many drinks containing alcohol do you have on a typical day when you are drinking?: 1 or 2 3. How often do you have six or more drinks on one occasion?: Never AUDIT-C Score: 1 4. How often during the last year have you found that you were not able to stop drinking once you had started?: Never 5. How often during the last year have you failed to do what was normally expected from you because of drinking?: Never 6. How often during the last year have you needed a first drink in the morning to get yourself going after a heavy drinking session?: Never 7. How often during the last year have you had a feeling of guilt of remorse after drinking?: Never 8. How often during the last year have you been unable to remember what happened the night before because you had been drinking?: Never 9. Have you or someone else been injured as a result of  your drinking?: No 10. Has a relative or friend or a doctor or another health worker been concerned about your drinking or suggested you cut down?: No Alcohol Use Disorder Identification Test Final Score (AUDIT): 1 Alcohol Brief Interventions/Follow-up: Patient Refused Substance Abuse History in the last 12 months:  No. Consequences of Substance Abuse: NA Previous Psychotropic Medications: Yes  Psychological Evaluations: Yes  Past Medical History:  Past Medical History:  Diagnosis Date   ADD  (attention deficit disorder)    Anemia    Asthma    Bicuspid aortic valve    Depression    GERD (gastroesophageal reflux disease)    Hypothyroidism    Migraine    Shoulder impingement syndrome, right     Past Surgical History:  Procedure Laterality Date   CYSTOSCOPY N/A 02/06/2023   Procedure: CYSTOSCOPY;  Surgeon: Conard Novak, MD;  Location: [facility] ORS;  Service: Gynecology;  Laterality: N/A;   ROBOTIC ASSISTED LAPAROSCOPIC HYSTERECTOMY AND SALPINGECTOMY Bilateral 02/06/2023   Procedure: XI ROBOTIC ASSISTED LAPAROSCOPIC HYSTERECTOMY AND SALPINGECTOMY;  Surgeon: Conard Novak, MD;  Location: [facility] ORS;  Service: Gynecology;  Laterality: Bilateral;   WISDOM TOOTH EXTRACTION     Family History:  Family History  Problem Relation Age of Onset   Heart disease Maternal Grandmother    Hypertension Maternal Grandmother    Stroke Maternal Grandmother    Diabetes Paternal Grandmother    Family Psychiatric  History: Unremarkable Tobacco Screening:  Social History   Tobacco Use  Smoking Status Never  Smokeless Tobacco Never    BH Tobacco Counseling     Are you interested in Tobacco Cessation Medications?  N/A, patient does not use tobacco products Counseled patient on smoking cessation:  N/A, patient does not use tobacco products Reason Tobacco Screening Not Completed: No value filed.       Social History:  Social History   Substance and Sexual Activity  Alcohol Use Yes   Comment: occasionally     Social History   Substance and Sexual Activity  Drug Use No   Comment: cbd    Additional Social History:                           Allergies:  No Known Allergies Lab Results:  Results for orders placed or performed during the hospital encounter of 11/04/23 (from the past 48 hour(s))  Comprehensive metabolic panel     Status: Abnormal   Collection Time: 11/04/23  3:42 PM  Result Value Ref Range   Sodium 137 135 - 145 mmol/L   Potassium 4.0 3.5 - 5.1  mmol/L   Chloride 104 98 - 111 mmol/L   CO2 25 22 - 32 mmol/L   Glucose, Bld 107 (H) 70 - 99 mg/dL    Comment: Glucose reference range applies only to samples taken after fasting for at least 8 hours.   BUN 7 6 - 20 mg/dL   Creatinine, Ser 1.06 0.44 - 1.00 mg/dL   Calcium 8.9 8.9 - 26.9 mg/dL   Total Protein 8.4 (H) 6.5 - 8.1 g/dL   Albumin 4.2 3.5 - 5.0 g/dL   AST 13 (L) 15 - 41 U/L   ALT 9 0 - 44 U/L   Alkaline Phosphatase 100 38 - 126 U/L   Total Bilirubin 0.2 <1.2 mg/dL   GFR, Estimated >48 >54 mL/min    Comment: (NOTE) Calculated using the CKD-EPI Creatinine Equation (2021)    Anion gap 8 5 - 15  Comment: Performed at [facility], [address] [place], [place] [address]  Ethanol     Status: None   Collection Time: 11/04/23  3:42 PM  Result Value Ref Range   Alcohol, Ethyl (B) <10 <10 mg/dL    Comment: (NOTE) Lowest detectable limit for serum alcohol is 10 mg/dL.  For medical purposes only. Performed at [facility], [address] [place], [place] [address]   Salicylate level     Status: Abnormal   Collection Time: 11/04/23  3:42 PM  Result Value Ref Range   Salicylate Lvl <7.0 (L) 7.0 - 30.0 mg/dL    Comment: Performed at [facility], [address] [place], [place] [address]  Acetaminophen level     Status: Abnormal   Collection Time: 11/04/23  3:42 PM  Result Value Ref Range   Acetaminophen (Tylenol), Serum <10 (L) 10 - 30 ug/mL    Comment: (NOTE) Therapeutic concentrations vary significantly. A range of 10-30 ug/mL  may be an effective concentration for many patients. However, some  are best treated at concentrations outside of this range. Acetaminophen concentrations >150 ug/mL at 4 hours after ingestion  and >50 ug/mL at 12 hours after ingestion are often associated with  toxic reactions.  Performed at [facility], [address] [place], [place] [address]   cbc     Status: Abnormal   Collection Time:  11/04/23  3:42 PM  Result Value Ref Range   WBC 6.5 4.0 - 10.5 K/uL   RBC 4.67 3.87 - 5.11 MIL/uL   Hemoglobin 12.0 12.0 - 15.0 g/dL   HCT 95.2 84.1 - 32.4 %   MCV 82.0 80.0 - 100.0 fL   MCH 25.7 (L) 26.0 - 34.0 pg   MCHC 31.3 30.0 - 36.0 g/dL   RDW 40.1 (H) 02.7 - 25.3 %   Platelets 270 150 - 400 K/uL   nRBC 0.0 0.0 - 0.2 %    Comment: Performed at [facility], [address], [place], [place] [address]  Urine Drug Screen, Qualitative     Status: Abnormal   Collection Time: 11/04/23  3:42 PM  Result Value Ref Range   Tricyclic, Ur Screen NONE DETECTED NONE DETECTED   Amphetamines, Ur Screen POSITIVE (A) NONE DETECTED   MDMA (Ecstasy)Ur Screen NONE DETECTED NONE DETECTED   Cocaine Metabolite,Ur [place] NONE DETECTED NONE DETECTED   Opiate, Ur Screen NONE DETECTED NONE DETECTED   Phencyclidine (PCP) Ur S NONE DETECTED NONE DETECTED   Cannabinoid 50 Ng, Ur [place] POSITIVE (A) NONE DETECTED   Barbiturates, Ur Screen NONE DETECTED NONE DETECTED   Benzodiazepine, Ur Scrn NONE DETECTED NONE DETECTED   Methadone Scn, Ur NONE DETECTED NONE DETECTED    Comment: (NOTE) Tricyclics + metabolites, urine    Cutoff 1000 ng/mL Amphetamines + metabolites, urine  Cutoff 1000 ng/mL MDMA (Ecstasy), urine              Cutoff 500 ng/mL Cocaine Metabolite, urine          Cutoff 300 ng/mL Opiate + metabolites, urine        Cutoff 300 ng/mL Phencyclidine (PCP), urine         Cutoff 25 ng/mL Cannabinoid, urine                 Cutoff 50 ng/mL Barbiturates + metabolites, urine  Cutoff 200 ng/mL Benzodiazepine, urine              Cutoff 200 ng/mL Methadone, urine  Cutoff 300 ng/mL  The urine drug screen provides only a preliminary, unconfirmed analytical test result and should not be used for non-medical purposes. Clinical consideration and professional judgment should be applied to any positive drug screen result due to possible interfering substances. A more specific alternate  chemical method must be used in order to obtain a confirmed analytical result. Gas chromatography / mass spectrometry (GC/MS) is the preferred confirm atory method. Performed at [facility], [address] [place], [place] [address]   POC Urine Pregnancy, ED     Status: None   Collection Time: 11/04/23  3:54 PM  Result Value Ref Range   Preg Test, Ur Negative Negative    Blood Alcohol level:  Lab Results  Component Value Date   ETH <10 11/04/2023    Metabolic Disorder Labs:  No results found for: "HGBA1C", "MPG" No results found for: "PROLACTIN" No results found for: "CHOL", "TRIG", "HDL", "CHOLHDL", "VLDL", "LDLCALC"  Current Medications: Current Facility-Administered Medications  Medication Dose Route Frequency Provider Last Rate Last Admin   acetaminophen (TYLENOL) tablet 650 mg  650 mg Oral Q6H PRN Lauree Chandler, NP       alum & mag hydroxide-simeth (MAALOX/MYLANTA) 200-200-20 MG/5ML suspension 30 mL  30 mL Oral Q4H PRN Lauree Chandler, NP   30 mL at 11/06/23 0903   buPROPion (WELLBUTRIN XL) 24 hr tablet 150 mg  150 mg Oral Daily Sarina Ill, DO       dexmethylphenidate (FOCALIN XR) 24 hr capsule 10 mg  10 mg Oral Daily Sarina Ill, DO       FLUoxetine (PROZAC) capsule 20 mg  20 mg Oral Daily Sarina Ill, DO       ibuprofen (ADVIL) tablet 600 mg  600 mg Oral Q6H PRN Sarina Ill, DO       [START ON 11/07/2023] levothyroxine (SYNTHROID) tablet 50 mcg  50 mcg Oral Q0600 Sarina Ill, DO       linaclotide Karlene Einstein) capsule 290 mcg  290 mcg Oral QODAY Amoni Scallan Edward, DO       LORazepam (ATIVAN) tablet 1 mg  1 mg Oral Q4H PRN Sarina Ill, DO       magnesium hydroxide (MILK OF MAGNESIA) suspension 30 mL  30 mL Oral Daily PRN Lauree Chandler, NP       montelukast (SINGULAIR) tablet 10 mg  10 mg Oral QHS Marlou Porch, Kaitlinn Iversen Edward, DO       OLANZapine [facility]) tablet 10 mg  10 mg  Oral Q6H PRN Sarina Ill, DO       ondansetron (ZOFRAN-ODT) disintegrating tablet 4 mg  4 mg Oral Q8H PRN Sarina Ill, DO       pantoprazole (PROTONIX) EC tablet 40 mg  40 mg Oral Daily Alayah Knouff Edward, DO       pregabalin (LYRICA) capsule 50 mg  50 mg Oral BID Sarina Ill, DO       prenatal multivitamin tablet 1 tablet  1 tablet Oral Q1200 Sarina Ill, DO       risperiDONE (RISPERDAL) tablet 0.5 mg  0.5 mg Oral [id number] Kelyse Pask Edward, DO       traZODone (DESYREL) tablet 50 mg  50 mg Oral QHS PRN Lauree Chandler, NP   50 mg at 11/05/23 2059   PTA Medications: Medications Prior to Admission  Medication Sig Dispense Refill Last Dose   albuterol (VENTOLIN HFA) 108 (90 Base) MCG/ACT inhaler Inhale 2  puffs into the lungs every 4 (four) hours as needed for wheezing or shortness of breath.   prn at unk   buPROPion (WELLBUTRIN XL) 150 MG 24 hr tablet Take 150 mg by mouth daily.   11/04/2023   busPIRone (BUSPAR) 7.5 MG tablet Take 7.5 mg by mouth 3 (three) times daily.   11/04/2023   Cholecalciferol (VITAMIN D-3 PO) Take 2,000 Units by mouth daily.   11/04/2023   cyanocobalamin (VITAMIN B12) 500 MCG tablet Take 1,000 mcg by mouth daily.   11/04/2023   dexmethylphenidate (FOCALIN XR) 10 MG 24 hr capsule Take 10 mg by mouth in the morning.   11/04/2023   escitalopram (LEXAPRO) 10 MG tablet Take 10 mg by mouth daily.   11/04/2023   escitalopram (LEXAPRO) 5 MG tablet Take 5 mg by mouth daily.   11/04/2023   levothyroxine (SYNTHROID) 50 MCG tablet Take 50 mcg by mouth daily before breakfast.   11/04/2023   magnesium oxide (MAG-OX) 400 (240 Mg) MG tablet Take 250 mg by mouth 2 (two) times daily.   11/04/2023   Naltrexone HCl, Pain, (NALTREX) 4.5 MG CAPS Take 4.5 mg by mouth daily.   11/04/2023   omeprazole (PRILOSEC) 40 MG capsule Take 40 mg by mouth in the morning and at bedtime.   11/04/2023   ondansetron (ZOFRAN ODT) 4 MG disintegrating tablet  Take 1 tablet (4 mg total) by mouth every 8 (eight) hours as needed. 15 tablet 0 11/04/2023   pregabalin (LYRICA) 50 MG capsule Take 50 mg by mouth 2 (two) times daily.   11/04/2023   rOPINIRole (REQUIP) 0.5 MG tablet Take 0.5 mg by mouth at bedtime.   11/04/2023   saccharomyces boulardii (FLORASTOR) 250 MG capsule Take 250 mg by mouth 2 (two) times daily.   11/04/2023   triamcinolone ointment (KENALOG) 0.5 % Apply 1 Application topically. In both ear canals once nightly x 1 months, once weekly thereafter   prn at unk   ascorbic acid (VITAMIN C) 500 MG tablet Take 500 mg by mouth 2 (two) times daily. (Patient not taking: Reported on 11/05/2023)   Not Taking   dexmethylphenidate (FOCALIN) 10 MG tablet Take 10 mg by mouth at bedtime as needed.      escitalopram (LEXAPRO) 20 MG tablet Take 20 mg by mouth daily. (Patient not taking: Reported on 11/05/2023)   Not Taking   estradiol (ESTRACE) 1 MG tablet Take 1 mg by mouth daily. (Patient not taking: Reported on 11/05/2023)   Not Taking   ibuprofen (ADVIL) 600 MG tablet Take 1 tablet (600 mg total) by mouth every 6 (six) hours. (Patient not taking: Reported on 11/05/2023) 30 tablet 0 Not Taking   linaclotide (LINZESS) 290 MCG CAPS capsule Take 290 mcg by mouth every other day.   11/03/2023   meclizine (ANTIVERT) 25 MG tablet Take 1 tablet (25 mg total) by mouth 3 (three) times daily as needed for dizziness. (Patient not taking: Reported on 11/05/2023) 15 tablet 0 Not Taking   montelukast (SINGULAIR) 10 MG tablet Take 10 mg by mouth at bedtime. (Patient not taking: Reported on 11/05/2023)   Not Taking   ondansetron (ZOFRAN-ODT) 4 MG disintegrating tablet Take 1 tablet (4 mg total) by mouth every 6 (six) hours as needed for nausea. 20 tablet 0     Musculoskeletal: Strength & Muscle Tone: within normal limits Gait & Station: normal Patient leans: N/A            Psychiatric Specialty Exam:  Presentation  General Appearance:  No data recorded Eye  Contact:No data recorded Speech:No data recorded Speech Volume:No data recorded Handedness:No data recorded  Mood and Affect  Mood:No data recorded Affect:No data recorded  Thought Process  Thought Processes:No data recorded Duration of Psychotic Symptoms:N/A Past Diagnosis of Schizophrenia or Psychoactive disorder: No  Descriptions of Associations:No data recorded Orientation:No data recorded Thought Content:No data recorded Hallucinations:No data recorded Ideas of Reference:No data recorded Suicidal Thoughts:No data recorded Homicidal Thoughts:No data recorded  Sensorium  Memory:No data recorded Judgment:No data recorded Insight:No data recorded  Executive Functions  Concentration:No data recorded Attention Span:No data recorded Recall:No data recorded Fund of Knowledge:No data recorded Language:No data recorded  Psychomotor Activity  Psychomotor Activity:No data recorded  Assets  Assets:No data recorded  Sleep  Sleep:No data recorded   Physical Exam: Physical Exam Constitutional:      Appearance: Normal appearance.  HENT:     Head: Normocephalic and atraumatic.     Mouth/Throat:     Pharynx: Oropharynx is clear.  Eyes:     Pupils: Pupils are equal, round, and reactive to light.  Cardiovascular:     Rate and Rhythm: Normal rate and regular rhythm.  Pulmonary:     Effort: Pulmonary effort is normal.     Breath sounds: Normal breath sounds.  Abdominal:     General: Abdomen is flat.     Palpations: Abdomen is soft.  Musculoskeletal:        General: Normal range of motion.  Skin:    General: Skin is warm and dry.  Neurological:     General: No focal deficit present.     Mental Status: She is alert. Mental status is at baseline.  Psychiatric:        Attention and Perception: Attention and perception normal.        Mood and Affect: Mood is anxious and depressed. Affect is flat.        Speech: Speech normal.        Behavior: Behavior is cooperative.         Thought Content: Thought content normal.        Cognition and Memory: Cognition and memory normal.        Judgment: Judgment normal.    Review of Systems  Constitutional: Negative.   HENT: Negative.    Eyes: Negative.   Respiratory: Negative.    Cardiovascular: Negative.   Gastrointestinal: Negative.   Genitourinary: Negative.   Musculoskeletal: Negative.   Skin: Negative.   Neurological: Negative.   Endo/Heme/Allergies: Negative.   Psychiatric/Behavioral: Negative.     Blood pressure (!) 126/91, pulse 86, temperature 98.6 F (37 C), temperature source Oral, resp. rate 18, height 5\' 9"  (1.753 m), weight 119.7 kg, last menstrual period 04/06/2018, SpO2 100%. Body mass index is 38.96 kg/m.  Treatment Plan Summary: Daily contact with patient to assess and evaluate symptoms and progress in treatment, Medication management, and Plan discontinue Lexapro and continue with Wellbutrin and add Prozac, trazodone, Risperdal.  Restart home medications  Observation Level/Precautions:  15 minute checks  Laboratory:  CBC Chemistry Profile  Psychotherapy:    Medications:    Consultations:    Discharge Concerns:    Estimated LOS:  Other:     Physician Treatment Plan for Primary Diagnosis: MDD (major depressive disorder) Long Term Goal(s): Improvement in symptoms so as ready for discharge  Short Term Goals: Ability to identify changes in lifestyle to reduce recurrence of condition will improve, Ability to verbalize feelings will improve, Ability to disclose and discuss suicidal  ideas, Ability to demonstrate self-control will improve, Ability to identify and develop effective coping behaviors will improve, Ability to maintain clinical measurements within normal limits will improve, Compliance with prescribed medications will improve, and Ability to identify triggers associated with substance abuse/mental health issues will improve  Physician Treatment Plan for Secondary Diagnosis:  Principal Problem:   MDD (major depressive disorder)   I certify that inpatient services furnished can reasonably be expected to improve the patient's condition.    Tanai Bouler Tresea Mall, DO 11/6/202411:39 AM

## 2023-11-06 NOTE — Progress Notes (Signed)
Pt calm and pleasant during assessment denying SI/HI/AVH. Pt stated she was feeling better today and thinks her new medications are helping her. Pt given education, support, and encouragement to be active in her treatment plan. Pt being monitored Q 15 minutes for safety per unit protocol, remains safe on the unit

## 2023-11-06 NOTE — Group Note (Signed)
Date:  11/06/2023 Time:  6:18 PM  Group Topic/Focus:  Wellness Toolbox:   The focus of this group is to discuss various aspects of wellness, balancing those aspects and exploring ways to increase the ability to experience wellness.  Patients will create a wellness toolbox for use upon discharge.    Participation Level:  Active  Participation Quality:  Appropriate  Affect:  Appropriate  Cognitive:  Appropriate  Insight: Appropriate  Engagement in Group:  Engaged  Modes of Intervention:  Activity and Education  Additional Comments:    Robin Skinner 11/06/2023, 6:18 PM

## 2023-11-06 NOTE — Plan of Care (Signed)
  Problem: Education: Goal: Mental status will improve Outcome: Progressing Goal: Verbalization of understanding the information provided will improve Outcome: Progressing   Problem: Activity: Goal: Interest or engagement in activities will improve Outcome: Progressing   Problem: Health Behavior/Discharge Planning: Goal: Compliance with treatment plan for underlying cause of condition will improve Outcome: Progressing   Problem: Safety: Goal: Periods of time without injury will increase Outcome: Progressing

## 2023-11-06 NOTE — [facility] Suicide Risk Assessment (Signed)
[facility] INPATIENT:  Family/Significant Other Suicide Prevention Education  Suicide Prevention Education:  Patient Refusal for Family/Significant Other Suicide Prevention Education: The patient Robin Skinner has refused to provide written consent for family/significant other to be provided Family/Significant Other Suicide Prevention Education during admission and/or prior to discharge.  Physician notified.  Lowry Ram 11/06/2023, 3:47 PM

## 2023-11-06 NOTE — Group Note (Signed)
Date:  11/06/2023 Time:  9:59 PM  Group Topic/Focus:  Orientation:   The focus of this group is to educate the patient on the purpose and policies of crisis stabilization and provide a format to answer questions about their admission.  The group details unit policies and expectations of patients while admitted.    Participation Level:  Active  Participation Quality:  Appropriate and Attentive  Affect:  Appropriate  Cognitive:  Alert and Appropriate  Insight: Appropriate, Good, and Improving  Engagement in Group:  Developing/Improving and Engaged  Modes of Intervention:  Clarification, Discussion, Education, Orientation, and Support  Additional Comments:     Charlee Squibb 11/06/2023, 9:59 PM

## 2023-11-06 NOTE — Group Note (Signed)
Date:  11/06/2023 Time:  10:27 AM  Group Topic/Focus:  Activity Group:  The focus of the group is to encourage patients to come outside to the courtyard to get some fresh air and exercise for the benefit of their mental health,    Participation Level:  Active  Participation Quality:  Appropriate  Affect:  Appropriate  Cognitive:  Appropriate  Insight: Appropriate  Engagement in Group:  Engaged  Modes of Intervention:  Activity  Additional Comments:    Mary Sella Desirre Eickhoff 11/06/2023, 10:27 AM
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
## 2023-11-06 NOTE — BH IP Treatment Plan (Signed)
Interdisciplinary Treatment and Diagnostic Plan Update  11/06/2023 Time of Session: 9:30AM Robin Skinner MRN: [id number]  Principal Diagnosis: MDD (major depressive disorder)  Secondary Diagnoses: Principal Problem:   MDD (major depressive disorder)   Current Medications:  Current Facility-Administered Medications  Medication Dose Route Frequency Provider Last Rate Last Admin   acetaminophen (TYLENOL) tablet 650 mg  650 mg Oral Q6H PRN Lauree Chandler, NP       alum & mag hydroxide-simeth (MAALOX/MYLANTA) 200-200-20 MG/5ML suspension 30 mL  30 mL Oral Q4H PRN Lauree Chandler, NP   30 mL at 11/06/23 0903   diphenhydrAMINE (BENADRYL) capsule 50 mg  50 mg Oral TID PRN Lauree Chandler, NP       Or   diphenhydrAMINE (BENADRYL) injection 50 mg  50 mg Intramuscular TID PRN Lauree Chandler, NP       haloperidol (HALDOL) tablet 5 mg  5 mg Oral TID PRN Lauree Chandler, NP       Or   haloperidol lactate (HALDOL) injection 5 mg  5 mg Intramuscular TID PRN Lauree Chandler, NP       hydrOXYzine (ATARAX) tablet 25 mg  25 mg Oral TID PRN Lauree Chandler, NP       LORazepam (ATIVAN) tablet 2 mg  2 mg Oral TID PRN Lauree Chandler, NP       Or   LORazepam (ATIVAN) injection 2 mg  2 mg Intramuscular TID PRN Lauree Chandler, NP       magnesium hydroxide (MILK OF MAGNESIA) suspension 30 mL  30 mL Oral Daily PRN Lauree Chandler, NP       traZODone (DESYREL) tablet 50 mg  50 mg Oral QHS PRN Lauree Chandler, NP   50 mg at 11/05/23 2059   PTA Medications: Medications Prior to Admission  Medication Sig Dispense Refill Last Dose   albuterol (VENTOLIN HFA) 108 (90 Base) MCG/ACT inhaler Inhale 2 puffs into the lungs every 4 (four) hours as needed for wheezing or shortness of breath.   prn at unk   buPROPion (WELLBUTRIN XL) 150 MG 24 hr tablet Take 150 mg by mouth daily.   11/04/2023   busPIRone (BUSPAR) 7.5 MG tablet Take 7.5 mg by mouth 3 (three) times daily.    11/04/2023   Cholecalciferol (VITAMIN D-3 PO) Take 2,000 Units by mouth daily.   11/04/2023   cyanocobalamin (VITAMIN B12) 500 MCG tablet Take 1,000 mcg by mouth daily.   11/04/2023   dexmethylphenidate (FOCALIN XR) 10 MG 24 hr capsule Take 10 mg by mouth in the morning.   11/04/2023   escitalopram (LEXAPRO) 10 MG tablet Take 10 mg by mouth daily.   11/04/2023   escitalopram (LEXAPRO) 5 MG tablet Take 5 mg by mouth daily.   11/04/2023   levothyroxine (SYNTHROID) 50 MCG tablet Take 50 mcg by mouth daily before breakfast.   11/04/2023   magnesium oxide (MAG-OX) 400 (240 Mg) MG tablet Take 250 mg by mouth 2 (two) times daily.   11/04/2023   Naltrexone HCl, Pain, (NALTREX) 4.5 MG CAPS Take 4.5 mg by mouth daily.   11/04/2023   omeprazole (PRILOSEC) 40 MG capsule Take 40 mg by mouth in the morning and at bedtime.   11/04/2023   ondansetron (ZOFRAN ODT) 4 MG disintegrating tablet Take 1 tablet (4 mg total) by mouth every 8 (eight) hours as needed. 15 tablet 0 11/04/2023   pregabalin (LYRICA) 50 MG capsule Take 50 mg by mouth 2 (two) times  daily.   11/04/2023   rOPINIRole (REQUIP) 0.5 MG tablet Take 0.5 mg by mouth at bedtime.   11/04/2023   saccharomyces boulardii (FLORASTOR) 250 MG capsule Take 250 mg by mouth 2 (two) times daily.   11/04/2023   triamcinolone ointment (KENALOG) 0.5 % Apply 1 Application topically. In both ear canals once nightly x 1 months, once weekly thereafter   prn at unk   ascorbic acid (VITAMIN C) 500 MG tablet Take 500 mg by mouth 2 (two) times daily. (Patient not taking: Reported on 11/05/2023)   Not Taking   dexmethylphenidate (FOCALIN) 10 MG tablet Take 10 mg by mouth at bedtime as needed.      escitalopram (LEXAPRO) 20 MG tablet Take 20 mg by mouth daily. (Patient not taking: Reported on 11/05/2023)   Not Taking   estradiol (ESTRACE) 1 MG tablet Take 1 mg by mouth daily. (Patient not taking: Reported on 11/05/2023)   Not Taking   ibuprofen (ADVIL) 600 MG tablet Take 1 tablet (600 mg total)  by mouth every 6 (six) hours. (Patient not taking: Reported on 11/05/2023) 30 tablet 0 Not Taking   linaclotide (LINZESS) 290 MCG CAPS capsule Take 290 mcg by mouth every other day.   11/03/2023   meclizine (ANTIVERT) 25 MG tablet Take 1 tablet (25 mg total) by mouth 3 (three) times daily as needed for dizziness. (Patient not taking: Reported on 11/05/2023) 15 tablet 0 Not Taking   montelukast (SINGULAIR) 10 MG tablet Take 10 mg by mouth at bedtime. (Patient not taking: Reported on 11/05/2023)   Not Taking   ondansetron (ZOFRAN-ODT) 4 MG disintegrating tablet Take 1 tablet (4 mg total) by mouth every 6 (six) hours as needed for nausea. 20 tablet 0     Patient Stressors: Financial difficulties    Patient Strengths: General fund of knowledge  Supportive family/friends  Work skills   Treatment Modalities: Medication Management, Group therapy, Case management,  1 to 1 session with clinician, Psychoeducation, Recreational therapy.   Physician Treatment Plan for Primary Diagnosis: MDD (major depressive disorder) Long Term Goal(s):     Short Term Goals:    Medication Management: Evaluate patient's response, side effects, and tolerance of medication regimen.  Therapeutic Interventions: 1 to 1 sessions, Unit Group sessions and Medication administration.  Evaluation of Outcomes: Not Met  Physician Treatment Plan for Secondary Diagnosis: Principal Problem:   MDD (major depressive disorder)  Long Term Goal(s):     Short Term Goals:       Medication Management: Evaluate patient's response, side effects, and tolerance of medication regimen.  Therapeutic Interventions: 1 to 1 sessions, Unit Group sessions and Medication administration.  Evaluation of Outcomes: Not Met   RN Treatment Plan for Primary Diagnosis: MDD (major depressive disorder) Long Term Goal(s): Knowledge of disease and therapeutic regimen to maintain health will improve  Short Term Goals: Ability to remain free from injury  will improve, Ability to verbalize frustration and anger appropriately will improve, Ability to demonstrate self-control, Ability to participate in decision making will improve, Ability to verbalize feelings will improve, Ability to disclose and discuss suicidal ideas, Ability to identify and develop effective coping behaviors will improve, and Compliance with prescribed medications will improve  Medication Management: RN will administer medications as ordered by provider, will assess and evaluate patient's response and provide education to patient for prescribed medication. RN will report any adverse and/or side effects to prescribing provider.  Therapeutic Interventions: 1 on 1 counseling sessions, Psychoeducation, Medication administration, Evaluate responses to treatment,  Monitor vital signs and CBGs as ordered, Perform/monitor CIWA, COWS, AIMS and Fall Risk screenings as ordered, Perform wound care treatments as ordered.  Evaluation of Outcomes: Not Met   LCSW Treatment Plan for Primary Diagnosis: MDD (major depressive disorder) Long Term Goal(s): Safe transition to appropriate next level of care at discharge, Engage patient in therapeutic group addressing interpersonal concerns.  Short Term Goals: Engage patient in aftercare planning with referrals and resources, Increase social support, Increase ability to appropriately verbalize feelings, Increase emotional regulation, Facilitate acceptance of mental health diagnosis and concerns, Facilitate patient progression through stages of change regarding substance use diagnoses and concerns, Identify triggers associated with mental health/substance abuse issues, and Increase skills for wellness and recovery  Therapeutic Interventions: Assess for all discharge needs, 1 to 1 time with Social worker, Explore available resources and support systems, Assess for adequacy in community support network, Educate family and significant other(s) on suicide  prevention, Complete Psychosocial Assessment, Interpersonal group therapy.  Evaluation of Outcomes: Not Met   Progress in Treatment: Attending groups: Yes. and No. Participating in groups: Yes. and No. Taking medication as prescribed: Yes. Toleration medication: Yes. Family/Significant other contact made: No, will contact:  CSW to contact once permission is granted.  Patient understands diagnosis: Yes. Discussing patient identified problems/goals with staff: Yes. Medical problems stabilized or resolved: Yes. Denies suicidal/homicidal ideation: No. Issues/concerns per patient self-inventory: Yes. Other: Patient is nervous about finances at home.   New problem(s) identified: No, Describe:  None  New Short Term/Long Term Goal(s):detox, elimination of symptoms of psychosis, medication management for mood stabilization; elimination of SI thoughts; development of comprehensive mental wellness/sobriety plan.    Patient Goals:  "I want to control the disassociation that has been happening."  Discharge Plan or Barriers: CSW to assist patient in the development of appropriate discharge plan.   Reason for Continuation of Hospitalization: Anxiety Delusions  Depression Hallucinations Homicidal ideation Mania Suicidal ideation  Estimated Length of Stay:1-7 days.   Last 3 [place] Suicide Severity Risk Score: Flowsheet Row Admission (Current) from 11/05/2023 in [facility] ED from 11/04/2023 in [facility] Emergency Department at [facility] Admission (Discharged) from 02/06/2023 in [facility]  C-SSRS RISK CATEGORY Moderate Risk High Risk No Risk       Last PHQ 2/9 Scores:    01/21/2018    3:46 PM  Depression screen PHQ 2/9  Decreased Interest 0  Down, Depressed, Hopeless 0  PHQ - 2 Score 0  Altered sleeping 0  Tired, decreased energy 0  Change in appetite 0  Feeling bad or failure about yourself  0  Trouble  concentrating 0  Moving slowly or fidgety/restless 0  Suicidal thoughts 0  PHQ-9 Score 0    Scribe for Treatment Team: Lowry Ram, LCSW 11/06/2023 11:18 AM

## 2023-11-06 NOTE — Progress Notes (Signed)
Patient endorsing passive SI but denies any current HI, and A/V/H. Patient stated she felt in a better mood today after taking her  new scheduled medication. Patient has been socializing appropriately and interacting with peers. Eating adequately and med compliant. Patient cooperative on unit.

## 2023-11-07 NOTE — Progress Notes (Signed)
   11/07/23 0845  Psych Admission Type (Psych Patients Only)  Admission Status Voluntary  Psychosocial Assessment  Patient Complaints None  Eye Contact Fair  Facial Expression Other (Comment) (appropriate)  Affect Appropriate to circumstance  Speech Logical/coherent  Interaction Assertive  Motor Activity Slow  Appearance/Hygiene Unremarkable  Behavior Characteristics Cooperative;Appropriate to situation  Mood Pleasant  Aggressive Behavior  Effect No apparent injury  Thought Process  Coherency WDL  Content WDL  Delusions None reported or observed  Perception WDL  Hallucination None reported or observed  Judgment WDL  Confusion None  Danger to Self  Current suicidal ideation? Denies  Self-Injurious Behavior No self-injurious ideation or behavior indicators observed or expressed   Agreement Not to Harm Self Yes  Description of Agreement Verbal  Danger to Others  Danger to Others None reported or observed

## 2023-11-07 NOTE — Group Note (Signed)
Recreation Therapy Group Note   Group Topic:Relaxation  Group Date: 11/07/2023 Start Time: 1005 End Time: 1055 Facilitators: Rosina Lowenstein, LRT, CTRS Location:  Craft Room  Group Description: PMR (Progressive Muscle Relaxation). LRT asks patients their current level of stress/anxiety from 1-10, with 10 being the highest. LRT educates patients on what PMR is and the benefits that come from it. Patients are asked to sit with their feet flat on the floor while sitting up and all the way back in their chair, if possible. LRT and pts follow a prompt through a speaker that requires you to tense and release different muscles in their body and focus on their breathing. During session, lights are off and soft music is being played. Pts are given a stress ball to use if needed. At the end of the prompt, LRT asks patients to rank their current levels of stress/anxiety from 1-10, 10 being the highest. LRT provides patients with an education handout on PMR.   Goal Area(s) Addressed:  Patients will be able to describe progressive muscle relaxation.  Patient will practice using relaxation technique. Patient will identify a new coping skill.  Patient will follow multistep directions to reduce anxiety and stress.   Affect/Mood: Appropriate   Participation Level: Active and Engaged   Participation Quality: Independent   Behavior: Calm and Cooperative   Speech/Thought Process: Coherent   Insight: Good   Judgement: Good   Modes of Intervention: Activity   Patient Response to Interventions:  Attentive, Engaged, Interested , and Receptive   Education Outcome:  Acknowledges education   Clinical Observations/Individualized Feedback: Robin Skinner was active in their participation of session activities and group discussion. Pt identified that her anxiety and stress were a 1 before the session. After, she shared that they were both a 0. Pt shared: "was relaxed before but I am more relaxed now". Pt interacted  well with LRT and pers duration of session.    Plan: Continue to engage patient in RT group sessions 2-3x/week.   Rosina Lowenstein, LRT, CTRS 11/07/2023 11:21 AM

## 2023-11-07 NOTE — Group Note (Signed)
Date:  11/07/2023 Time:  9:30 PM  Group Topic/Focus:  Wrap-Up Group:   The focus of this group is to help patients review their daily goal of treatment and discuss progress on daily workbooks.    Participation Level:  Active  Participation Quality:  Appropriate, Attentive, Sharing, and Supportive  Affect:  Appropriate  Cognitive:  Appropriate  Insight: Appropriate and Good  Engagement in Group:  Supportive  Modes of Intervention:  Discussion and Support  Additional Comments:     Belva Crome 11/07/2023, 9:30 PM

## 2023-11-07 NOTE — Group Note (Signed)
Date:  11/07/2023 Time:  10:23 AM  Group Topic/Focus:  Goals Group:   The focus of this group is to help patients establish daily goals to achieve during treatment and discuss how the patient can incorporate goal setting into their daily lives to aide in recovery.    Participation Level:  Active  Participation Quality:  Appropriate  Affect:  Appropriate  Cognitive:  Appropriate  Insight: Appropriate  Engagement in Group:  Engaged  Modes of Intervention:  Discussion, Education, and Support  Additional Comments:    Wilford Corner 11/07/2023, 10:23 AM

## 2023-11-07 NOTE — Plan of Care (Signed)

## 2023-11-07 NOTE — Group Note (Unsigned)
Date:  11/07/2023 Time:  9:22 PM  Group Topic/Focus:  Wrap-Up Group:   The focus of this group is to help patients review their daily goal of treatment and discuss progress on daily workbooks.     Participation Level:  {BHH PARTICIPATION WUJWJ:19147}  Participation Quality:  {BHH PARTICIPATION QUALITY:22265}  Affect:  {BHH AFFECT:22266}  Cognitive:  {BHH COGNITIVE:22267}  Insight: {BHH Insight2:20797}  Engagement in Group:  {BHH ENGAGEMENT IN WGNFA:21308}  Modes of Intervention:  {BHH MODES OF INTERVENTION:22269}  Additional Comments:  ***  Belva Crome 11/07/2023, 9:22 PM

## 2023-11-07 NOTE — Plan of Care (Signed)
 Pt denies SI/HI/AVH, compliant with procedures on the unit   Problem: Education: Goal: Knowledge of [facility] Education information/materials will improve Outcome: Progressing Goal: Emotional status will improve Outcome: Progressing Goal: Mental status will improve Outcome: Progressing Goal: Verbalization of understanding the information provided will improve Outcome: Progressing   Problem: Activity: Goal: Interest or engagement in activities will improve Outcome: Progressing Goal: Sleeping patterns will improve Outcome: Progressing   Problem: Coping: Goal: Ability to verbalize frustrations and anger appropriately will improve Outcome: Progressing Goal: Ability to demonstrate self-control will improve Outcome: Progressing   Problem: Health Behavior/Discharge Planning: Goal: Identification of resources available to assist in meeting health care needs will improve Outcome: Progressing Goal: Compliance with treatment plan for underlying cause of condition will improve Outcome: Progressing   Problem: Physical Regulation: Goal: Ability to maintain clinical measurements within normal limits will improve Outcome: Progressing   Problem: Safety: Goal: Periods of time without injury will increase Outcome: Progressing

## 2023-11-07 NOTE — Progress Notes (Signed)
[facility] MD Progress Note  11/07/2023 10:23 AM Robin Skinner  MRN:  [id number] Subjective: Robin Skinner is seen on rounds.  I changed her medications yesterday and she states that she is feeling much better today.  She states that she slept well and her appetite is good.  She states that she had a good visit with her husband last night.  No side effects from her medications and nurses report no issues. Principal Problem: MDD (major depressive disorder) Diagnosis: Principal Problem:   MDD (major depressive disorder)  Total Time spent with patient: 15 minutes  Past Psychiatric History: History of depression since endorsed to [facility] by telepsych.  Past Medical History:  Past Medical History:  Diagnosis Date   ADD (attention deficit disorder)    Anemia    Asthma    Bicuspid aortic valve    Depression    GERD (gastroesophageal reflux disease)    Hypothyroidism    Migraine    Shoulder impingement syndrome, right     Past Surgical History:  Procedure Laterality Date   CYSTOSCOPY N/A 02/06/2023   Procedure: CYSTOSCOPY;  Surgeon: Conard Novak, MD;  Location: [facility] ORS;  Service: Gynecology;  Laterality: N/A;   ROBOTIC ASSISTED LAPAROSCOPIC HYSTERECTOMY AND SALPINGECTOMY Bilateral 02/06/2023   Procedure: XI ROBOTIC ASSISTED LAPAROSCOPIC HYSTERECTOMY AND SALPINGECTOMY;  Surgeon: Conard Novak, MD;  Location: [facility] ORS;  Service: Gynecology;  Laterality: Bilateral;   WISDOM TOOTH EXTRACTION     Family History:  Family History  Problem Relation Age of Onset   Heart disease Maternal Grandmother    Hypertension Maternal Grandmother    Stroke Maternal Grandmother    Diabetes Paternal Grandmother    Family Psychiatric  History: Unremarkable Social History:  Social History   Substance and Sexual Activity  Alcohol Use Yes   Comment: occasionally     Social History   Substance and Sexual Activity  Drug Use No   Comment: cbd    Social History   Socioeconomic History    Marital status: Married    Spouse name: Not on file   Number of children: Not on file   Years of education: Not on file   Highest education level: Not on file  Occupational History   Not on file  Tobacco Use   Smoking status: Never   Smokeless tobacco: Never  Vaping Use   Vaping status: Never Used  Substance and Sexual Activity   Alcohol use: Yes    Comment: occasionally   Drug use: No    Comment: cbd   Sexual activity: Not on file  Other Topics Concern   Not on file  Social History Narrative   Not on file   Social Determinants of Health   Financial Resource Strain: Medium Risk (01/11/2023)   Received from [facility], [facility]   Overall Financial Resource Strain (CARDIA)    Difficulty of Paying Living Expenses: Somewhat hard  Food Insecurity: Food Insecurity Present (11/05/2023)   Hunger Vital Sign    Worried About Running Out of Food in the Last Year: Sometimes true    Ran Out of Food in the Last Year: Never true  Transportation Needs: No Transportation Needs (11/05/2023)   PRAPARE - Administrator, Civil Service (Medical): No    Lack of Transportation (Non-Medical): No  Physical Activity: Insufficiently Active (01/11/2023)   Received from [facility], [facility]   Exercise Vital Sign    Days of Exercise per  Week: 3 days    Minutes of Exercise per Session: 30 min  Stress: Stress Concern Present (01/11/2023)   Received from [facility], [facility]   [employer] of Occupational Health - Occupational Stress Questionnaire    Feeling of Stress : To some extent  Social Connections: Moderately Isolated (01/11/2023)   Received from [facility], [facility]   Social Connection and Isolation Panel [NHANES]    Frequency of Communication with Friends and Family: Once a week    Frequency of Social Gatherings with  Friends and Family: Once a week    Attends Religious Services: 1 to 4 times per year    Active Member of [employer] or Organizations: No    Attends Engineer, structural: Never    Marital Status: Married   Additional Social History:                         Sleep: Good  Appetite:  Good  Current Medications: Current Facility-Administered Medications  Medication Dose Route Frequency Provider Last Rate Last Admin   acetaminophen (TYLENOL) tablet 650 mg  650 mg Oral Q6H PRN Lauree Chandler, NP       alum & mag hydroxide-simeth (MAALOX/MYLANTA) 200-200-20 MG/5ML suspension 30 mL  30 mL Oral Q4H PRN Lauree Chandler, NP   30 mL at 11/06/23 0903   buPROPion (WELLBUTRIN XL) 24 hr tablet 150 mg  150 mg Oral Daily Sarina Ill, DO   150 mg at 11/07/23 1610   dexmethylphenidate (FOCALIN XR) 24 hr capsule 10 mg  10 mg Oral Daily Sarina Ill, DO   10 mg at 11/06/23 1537   FLUoxetine (PROZAC) capsule 20 mg  20 mg Oral Daily Sarina Ill, DO   20 mg at 11/07/23 9604   ibuprofen (ADVIL) tablet 600 mg  600 mg Oral Q6H PRN Sarina Ill, DO       levothyroxine (SYNTHROID) tablet 50 mcg  50 mcg Oral Q0600 Sarina Ill, DO   50 mcg at 11/07/23 0631   linaclotide (LINZESS) capsule 290 mcg  290 mcg Oral Maida Sale, DO   290 mcg at 11/06/23 1536   LORazepam (ATIVAN) tablet 1 mg  1 mg Oral Q4H PRN Sarina Ill, DO       magnesium hydroxide (MILK OF MAGNESIA) suspension 30 mL  30 mL Oral Daily PRN Lauree Chandler, NP       montelukast (SINGULAIR) tablet 10 mg  10 mg Oral QHS Sarina Ill, DO   10 mg at 11/06/23 2110   OLANZapine (ZYPREXA) tablet 10 mg  10 mg Oral Q6H PRN Sarina Ill, DO       ondansetron (ZOFRAN-ODT) disintegrating tablet 4 mg  4 mg Oral Q8H PRN Sarina Ill, DO       pantoprazole (PROTONIX) EC tablet 40 mg  40 mg Oral Daily Sarina Ill, DO    40 mg at 11/07/23 0838   pregabalin (LYRICA) capsule 50 mg  50 mg Oral BID Sarina Ill, DO   50 mg at 11/07/23 5409   prenatal multivitamin tablet 1 tablet  1 tablet Oral Q1200 Sarina Ill, DO   1 tablet at 11/06/23 1402   risperiDONE (RISPERDAL) tablet 0.5 mg  0.5 mg Oral BH-q8a4p Sarina Ill, DO   0.5 mg at 11/07/23 0838   traZODone (DESYREL) tablet 50 mg  50  mg Oral QHS PRN Lauree Chandler, NP   50 mg at 11/05/23 2059    Lab Results: No results found for this or any previous visit (from the past 48 hour(s)).  Blood Alcohol level:  Lab Results  Component Value Date   ETH <10 11/04/2023    Metabolic Disorder Labs: No results found for: "HGBA1C", "MPG" No results found for: "PROLACTIN" No results found for: "CHOL", "TRIG", "HDL", "CHOLHDL", "VLDL", "LDLCALC"  Physical Findings: AIMS:  , ,  ,  ,    CIWA:    COWS:     Musculoskeletal: Strength & Muscle Tone: within normal limits Gait & Station: normal Patient leans: N/A  Psychiatric Specialty Exam:  Presentation  General Appearance: No data recorded Eye Contact:No data recorded Speech:No data recorded Speech Volume:No data recorded Handedness:No data recorded  Mood and Affect  Mood:No data recorded Affect:No data recorded  Thought Process  Thought Processes:No data recorded Descriptions of Associations:No data recorded Orientation:No data recorded Thought Content:No data recorded History of Schizophrenia/Schizoaffective disorder:No  Duration of Psychotic Symptoms:No data recorded Hallucinations:No data recorded Ideas of Reference:No data recorded Suicidal Thoughts:No data recorded Homicidal Thoughts:No data recorded  Sensorium  Memory:No data recorded Judgment:No data recorded Insight:No data recorded  Executive Functions  Concentration:No data recorded Attention Span:No data recorded Recall:No data recorded Fund of Knowledge:No data recorded Language:No data  recorded  Psychomotor Activity  Psychomotor Activity:No data recorded  Assets  Assets:No data recorded  Sleep  Sleep:No data recorded    Blood pressure 118/81, pulse (!) 109, temperature 98.6 F (37 C), temperature source Oral, resp. rate 19, height 5\' 9"  (1.753 m), weight 119.7 kg, last menstrual period 04/06/2018, SpO2 97%. Body mass index is 38.96 kg/m.   Treatment Plan Summary: Daily contact with patient to assess and evaluate symptoms and progress in treatment, Medication management, and Plan continue current medications.  Sarina Ill, DO 11/07/2023, 10:23 AM

## 2023-11-08 DIAGNOSIS — F333 Major depressive disorder, recurrent, severe with psychotic symptoms: Secondary | ICD-10-CM

## 2023-11-08 LAB — LIPID PANEL
Cholesterol: 152 mg/dL (ref 0–200)
HDL: 42 mg/dL (ref >40)
LDL Cholesterol: 92 mg/dL (ref 0–99)
Total CHOL/HDL Ratio: 3.6 {ratio}
Triglycerides: 91 mg/dL (ref <150)
VLDL: 18 mg/dL (ref 0–40)

## 2023-11-08 LAB — HEMOGLOBIN A1C
Hgb A1c MFr Bld: 5.2 % (ref 4.8–5.6)
Mean Plasma Glucose: 102.54 mg/dL

## 2023-11-08 MED ORDER — RISPERIDONE 1 MG PO TABS
0.5000 mg | ORAL_TABLET | ORAL | Status: DC
  Administered 2023-11-08 – 2023-11-09 (×2): 0.5 mg via ORAL
  Filled 2023-11-08 (×2): qty 1

## 2023-11-08 MED ORDER — ROPINIROLE HCL 0.25 MG PO TABS
0.5000 mg | ORAL_TABLET | Freq: Every day | ORAL | Status: DC
  Administered 2023-11-08 – 2023-11-11 (×4): 0.5 mg via ORAL
  Filled 2023-11-08 (×4): qty 2

## 2023-11-08 NOTE — Progress Notes (Signed)
D- Patient alert and oriented. Patient presented in a pleasant mood on assessment reporting that she slept good last night and had complaints of shoulder pain, in which she requested PRN pain medication to help with relief. Patient denied SI, HI, AVH at this time. Patient also denied any signs and symptoms of depression and anxiety. Patient's stated goal for today is to "brush my teeth twice a day and get the form to fill out for financial help from the case workers".  A- Scheduled medications administered to patient, per MD orders. Support and encouragement provided. Routine safety checks conducted every 15 minutes. Patient informed to notify staff with problems or concerns.  R- No adverse drug reactions noted. Patient contracts for safety at this time. Patient compliant with medications and treatment plan. Patient receptive, calm, and cooperative. Patient interacts well with others on the unit. Patient remains safe at this time.   11/08/23 0830  Psych Admission Type (Psych Patients Only)  Admission Status Voluntary  Psychosocial Assessment  Patient Complaints None  Eye Contact Fair  Facial Expression Other (Comment) (appropriate)  Affect Appropriate to circumstance  Speech Logical/coherent  Interaction Assertive  Motor Activity Slow  Appearance/Hygiene Unremarkable  Behavior Characteristics Cooperative;Appropriate to situation  Mood Pleasant  Aggressive Behavior  Effect No apparent injury  Thought Process  Coherency WDL  Content WDL  Delusions None reported or observed  Perception WDL  Hallucination None reported or observed  Judgment WDL  Confusion None  Danger to Self  Current suicidal ideation? Denies  Self-Injurious Behavior No self-injurious ideation or behavior indicators observed or expressed   Agreement Not to Harm Self Yes  Description of Agreement Verbal  Danger to Others  Danger to Others None reported or observed

## 2023-11-08 NOTE — Plan of Care (Signed)

## 2023-11-08 NOTE — Group Note (Signed)
Recreation Therapy Group Note   Group Topic:Leisure Education  Group Date: 11/08/2023 Start Time: 1000 End Time: 1100 Facilitators: Rosina Lowenstein, LRT, CTRS Location:  Craft Room  Group Description: Leisure. Patients were given the option to choose from singing karaoke, coloring mandalas, using oil pastels, journaling, or playing with play-doh. LRT and pts discussed the meaning of leisure, the importance of participating in leisure during their free time/when they're outside of the hospital, as well as how our leisure interests can also serve as coping skills.   Goal Area(s) Addressed:  Patient will identify a current leisure interest.  Patient will learn the definition of "leisure". Patient will practice making a positive decision. Patient will have the opportunity to try a new leisure activity. Patient will communicate with peers and LRT.    Affect/Mood: Appropriate   Participation Level: Active and Engaged   Participation Quality: Independent   Behavior: Calm and Cooperative   Speech/Thought Process: Coherent   Insight: Good   Judgement: Good   Modes of Intervention: Activity   Patient Response to Interventions:  Attentive, Engaged, Interested , and Receptive   Education Outcome:  Acknowledges education   Clinical Observations/Individualized Feedback: Wille Glaser was active in their participation of session activities and group discussion. Pt identified "play games with friends and draw" as things she does in her free time. Pt chose to sing karaoke and draw with oil pastels while in group. Pt interacted well with LRT and peers duration of session.    Plan: Continue to engage patient in RT group sessions 2-3x/week.   Rosina Lowenstein, LRT, CTRS 11/08/2023 12:01 PM

## 2023-11-08 NOTE — Group Note (Signed)
Date:  11/08/2023 Time:  6:14 PM  Group Topic/Focus:  Making Healthy Choices:   The focus of this group is to help patients identify negative/unhealthy choices they were using prior to admission and identify positive/healthier coping strategies to replace them upon discharge.    Participation Level:  Active  Participation Quality:  Appropriate  Affect:  Appropriate  Cognitive:  Appropriate  Insight: Appropriate  Engagement in Group:  Engaged  Modes of Intervention:  Activity, Discussion, and Socialization  Additional Comments:    Wilford Corner 11/08/2023, 6:14 PM

## 2023-11-08 NOTE — Group Note (Unsigned)
Date:  11/08/2023 Time:  10:32 PM  Group Topic/Focus:  Wrap-Up Group:   The focus of this group is to help patients review their daily goal of treatment and discuss progress on daily workbooks.     Participation Level:  {BHH PARTICIPATION XBJYN:82956}  Participation Quality:  {BHH PARTICIPATION QUALITY:22265}  Affect:  {BHH AFFECT:22266}  Cognitive:  {BHH COGNITIVE:22267}  Insight: {BHH Insight2:20797}  Engagement in Group:  {BHH ENGAGEMENT IN OZHYQ:65784}  Modes of Intervention:  {BHH MODES OF INTERVENTION:22269}  Additional Comments:  ***  Lenore Cordia 11/08/2023, 10:32 PM

## 2023-11-08 NOTE — Group Note (Signed)
Date:  11/08/2023 Time:  10:51 PM  Group Topic/Focus:  Wrap-Up Group:   The focus of this group is to help patients review their daily goal of treatment and discuss progress on daily workbooks.    Participation Level:  Active  Participation Quality:  Appropriate  Affect:  Appropriate  Cognitive:  Appropriate  Insight: Appropriate  Engagement in Group:  Engaged  Modes of Intervention:  Discussion  Lenore Cordia 11/08/2023, 10:51 PM

## 2023-11-08 NOTE — Group Note (Unsigned)
Date:  11/08/2023 Time:  11:23 PM  Group Topic/Focus:  Wrap-Up Group:   The focus of this group is to help patients review their daily goal of treatment and discuss progress on daily workbooks.     Participation Level:  {BHH PARTICIPATION OZHYQ:65784}  Participation Quality:  {BHH PARTICIPATION QUALITY:22265}  Affect:  {BHH AFFECT:22266}  Cognitive:  {BHH COGNITIVE:22267}  Insight: {BHH Insight2:20797}  Engagement in Group:  {BHH ENGAGEMENT IN ONGEX:52841}  Modes of Intervention:  {BHH MODES OF INTERVENTION:22269}  Additional Comments:  ***  Lenore Cordia 11/08/2023, 11:23 PM

## 2023-11-08 NOTE — Group Note (Signed)
Date:  11/08/2023 Time:  12:27 PM  Group Topic/Focus:  Developing a Wellness Toolbox:   The focus of this group is to help patients develop a "wellness toolbox" with skills and strategies to promote recovery upon discharge.    Participation Level:  Active  Participation Quality:  Appropriate  Affect:  Appropriate  Cognitive:  Appropriate  Insight: Appropriate  Engagement in Group:  Engaged  Modes of Intervention:  Activity  Additional Comments:    Ophelia Shoulder 11/08/2023, 12:27 PM

## 2023-11-08 NOTE — Progress Notes (Signed)
[facility] MD Progress Note  11/08/2023 2:19 PM Rithvika Kofron  MRN:  [id number] Subjective: Robin Skinner is seen on rounds.  She tells me today that her suicidal thoughts returned earlier than her 4:00 dose and we talked about going to 3 times a day and she said that that would probably work.  Other than that she feels like the medications have been helpful.  She is sleeping better.  She denies any side effects from the medicine.  She tells me that she also takes Requip at bedtime for restless leg syndrome. Principal Problem: MDD (major depressive disorder) Diagnosis: Principal Problem:   MDD (major depressive disorder)  Total Time spent with patient: 15 minutes  Past Psychiatric History: Depression  Past Medical History:  Past Medical History:  Diagnosis Date   ADD (attention deficit disorder)    Anemia    Asthma    Bicuspid aortic valve    Depression    GERD (gastroesophageal reflux disease)    Hypothyroidism    Migraine    Shoulder impingement syndrome, right     Past Surgical History:  Procedure Laterality Date   CYSTOSCOPY N/A 02/06/2023   Procedure: CYSTOSCOPY;  Surgeon: Conard Novak, MD;  Location: [facility] ORS;  Service: Gynecology;  Laterality: N/A;   ROBOTIC ASSISTED LAPAROSCOPIC HYSTERECTOMY AND SALPINGECTOMY Bilateral 02/06/2023   Procedure: XI ROBOTIC ASSISTED LAPAROSCOPIC HYSTERECTOMY AND SALPINGECTOMY;  Surgeon: Conard Novak, MD;  Location: [facility] ORS;  Service: Gynecology;  Laterality: Bilateral;   WISDOM TOOTH EXTRACTION     Family History:  Family History  Problem Relation Age of Onset   Heart disease Maternal Grandmother    Hypertension Maternal Grandmother    Stroke Maternal Grandmother    Diabetes Paternal Grandmother    Family Psychiatric  History: Unremarkable Social History:  Social History   Substance and Sexual Activity  Alcohol Use Yes   Comment: occasionally     Social History   Substance and Sexual Activity  Drug Use No   Comment: cbd     Social History   Socioeconomic History   Marital status: Married    Spouse name: Not on file   Number of children: Not on file   Years of education: Not on file   Highest education level: Not on file  Occupational History   Not on file  Tobacco Use   Smoking status: Never   Smokeless tobacco: Never  Vaping Use   Vaping status: Never Used  Substance and Sexual Activity   Alcohol use: Yes    Comment: occasionally   Drug use: No    Comment: cbd   Sexual activity: Not on file  Other Topics Concern   Not on file  Social History Narrative   Not on file   Social Determinants of Health   Financial Resource Strain: Medium Risk (01/11/2023)   Received from [facility], [facility]   Overall Financial Resource Strain (CARDIA)    Difficulty of Paying Living Expenses: Somewhat hard  Food Insecurity: Food Insecurity Present (11/05/2023)   Hunger Vital Sign    Worried About Running Out of Food in the Last Year: Sometimes true    Ran Out of Food in the Last Year: Never true  Transportation Needs: No Transportation Needs (11/05/2023)   PRAPARE - Administrator, Civil Service (Medical): No    Lack of Transportation (Non-Medical): No  Physical Activity: Insufficiently Active (01/11/2023)   Received from [facility], [facility]  Exercise Vital Sign    Days of Exercise per Week: 3 days    Minutes of Exercise per Session: 30 min  Stress: Stress Concern Present (01/11/2023)   Received from [facility], [facility]   [employer] of Occupational Health - Occupational Stress Questionnaire    Feeling of Stress : To some extent  Social Connections: Moderately Isolated (01/11/2023)   Received from [facility], [facility]   Social Connection and Isolation Panel [NHANES]    Frequency of Communication with Friends and Family: Once a week     Frequency of Social Gatherings with Friends and Family: Once a week    Attends Religious Services: 1 to 4 times per year    Active Member of [employer] or Organizations: No    Attends Engineer, structural: Never    Marital Status: Married   Additional Social History:                         Sleep: Good  Appetite:  Good  Current Medications: Current Facility-Administered Medications  Medication Dose Route Frequency Provider Last Rate Last Admin   acetaminophen (TYLENOL) tablet 650 mg  650 mg Oral Q6H PRN Lauree Chandler, NP   650 mg at 11/08/23 0829   alum & mag hydroxide-simeth (MAALOX/MYLANTA) 200-200-20 MG/5ML suspension 30 mL  30 mL Oral Q4H PRN Lauree Chandler, NP   30 mL at 11/06/23 0903   buPROPion (WELLBUTRIN XL) 24 hr tablet 150 mg  150 mg Oral Daily Sarina Ill, DO   150 mg at 11/08/23 0827   dexmethylphenidate (FOCALIN XR) 24 hr capsule 10 mg  10 mg Oral Daily Sarina Ill, DO   10 mg at 11/08/23 0827   FLUoxetine (PROZAC) capsule 20 mg  20 mg Oral Daily Sarina Ill, DO   20 mg at 11/08/23 0827   ibuprofen (ADVIL) tablet 600 mg  600 mg Oral Q6H PRN Sarina Ill, DO       levothyroxine (SYNTHROID) tablet 50 mcg  50 mcg Oral Q0600 Sarina Ill, DO   50 mcg at 11/08/23 1610   linaclotide (LINZESS) capsule 290 mcg  290 mcg Oral Maida Sale, DO   290 mcg at 11/08/23 1113   LORazepam (ATIVAN) tablet 1 mg  1 mg Oral Q4H PRN Sarina Ill, DO       magnesium hydroxide (MILK OF MAGNESIA) suspension 30 mL  30 mL Oral Daily PRN Lauree Chandler, NP       montelukast (SINGULAIR) tablet 10 mg  10 mg Oral QHS Sarina Ill, DO   10 mg at 11/07/23 2112   OLANZapine (ZYPREXA) tablet 10 mg  10 mg Oral Q6H PRN Sarina Ill, DO       ondansetron (ZOFRAN-ODT) disintegrating tablet 4 mg  4 mg Oral Q8H PRN Sarina Ill, DO       pantoprazole (PROTONIX) EC  tablet 40 mg  40 mg Oral Daily Sarina Ill, DO   40 mg at 11/08/23 0827   pregabalin (LYRICA) capsule 50 mg  50 mg Oral BID Sarina Ill, DO   50 mg at 11/08/23 0827   prenatal multivitamin tablet 1 tablet  1 tablet Oral Q1200 Sarina Ill, DO   1 tablet at 11/08/23 1113   risperiDONE (RISPERDAL) tablet 0.5 mg  0.5 mg Oral [id number] Sarina Ill, DO  traZODone (DESYREL) tablet 50 mg  50 mg Oral QHS PRN Lauree Chandler, NP   50 mg at 11/05/23 2059    Lab Results:  Results for orders placed or performed during the hospital encounter of 11/05/23 (from the past 48 hour(s))  Lipid panel     Status: None   Collection Time: 11/08/23  7:08 AM  Result Value Ref Range   Cholesterol 152 0 - 200 mg/dL   Triglycerides 91 <638 mg/dL   HDL 42 >75 mg/dL   Total CHOL/HDL Ratio 3.6 RATIO   VLDL 18 0 - 40 mg/dL   LDL Cholesterol 92 0 - 99 mg/dL    Comment:        Total Cholesterol/HDL:CHD Risk Coronary Heart Disease Risk Table                     Men   Women  1/2 Average Risk   3.4   3.3  Average Risk       5.0   4.4  2 X Average Risk   9.6   7.1  3 X Average Risk  23.4   11.0        Use the calculated Patient Ratio above and the CHD Risk Table to determine the patient's CHD Risk.        ATP III CLASSIFICATION (LDL):  <100     mg/dL   Optimal  643-329  mg/dL   Near or Above                    Optimal  130-159  mg/dL   Borderline  518-841  mg/dL   High  >660     mg/dL   Very High Performed at [facility], [address] [place], [place] [address]   Hemoglobin A1c     Status: None   Collection Time: 11/08/23  7:09 AM  Result Value Ref Range   Hgb A1c MFr Bld 5.2 4.8 - 5.6 %    Comment: (NOTE) Pre diabetes:          5.7%-6.4%  Diabetes:              >6.4%  Glycemic control for   <7.0% adults with diabetes    Mean Plasma Glucose 102.54 mg/dL    Comment: Performed at [facility], [address] [place],  [place] [address]    Blood Alcohol level:  Lab Results  Component Value Date   ETH <10 11/04/2023    Metabolic Disorder Labs: Lab Results  Component Value Date   HGBA1C 5.2 11/08/2023   MPG 102.54 11/08/2023   No results found for: "PROLACTIN" Lab Results  Component Value Date   CHOL 152 11/08/2023   TRIG 91 11/08/2023   HDL 42 11/08/2023   CHOLHDL 3.6 11/08/2023   VLDL 18 11/08/2023   LDLCALC 92 11/08/2023    Physical Findings: AIMS:  , ,  ,  ,    CIWA:    COWS:     Musculoskeletal: Strength & Muscle Tone: within normal limits Gait & Station: normal Patient leans: N/A  Psychiatric Specialty Exam:  Presentation  General Appearance: No data recorded Eye Contact:No data recorded Speech:No data recorded Speech Volume:No data recorded Handedness:No data recorded  Mood and Affect  Mood:No data recorded Affect:No data recorded  Thought Process  Thought Processes:No data recorded Descriptions of Associations:No data recorded Orientation:No data recorded Thought Content:No data recorded History of Schizophrenia/Schizoaffective disorder:No  Duration of Psychotic  Symptoms:No data recorded Hallucinations:No data recorded Ideas of Reference:No data recorded Suicidal Thoughts:No data recorded Homicidal Thoughts:No data recorded  Sensorium  Memory:No data recorded Judgment:No data recorded Insight:No data recorded  Executive Functions  Concentration:No data recorded Attention Span:No data recorded Recall:No data recorded Fund of Knowledge:No data recorded Language:No data recorded  Psychomotor Activity  Psychomotor Activity:No data recorded  Assets  Assets:No data recorded  Sleep  Sleep:No data recorded    Blood pressure 118/71, pulse 96, temperature 97.6 F (36.4 C), resp. rate 18, height 5\' 9"  (1.753 m), weight 119.7 kg, last menstrual period 04/06/2018, SpO2 100%. Body mass index is 38.96 kg/m.   Treatment Plan Summary: Daily contact with patient  to assess and evaluate symptoms and progress in treatment, Medication management, and Plan increase Risperdal to 0.53 times a day and start Requip 0.5 at bedtime.  Sarina Ill, DO 11/08/2023, 2:19 PM

## 2023-11-08 NOTE — Group Note (Signed)
Date:  11/08/2023 Time:  12:54 PM  Group Topic/Focus:  Coping With Mental Health Crisis:   The purpose of this group is to help patients identify strategies for coping with mental health crisis.  Group discusses possible causes of crisis and ways to manage them effectively. Goals Group:   The focus of this group is to help patients establish daily goals to achieve during treatment and discuss how the patient can incorporate goal setting into their daily lives to aide in recovery.    Participation Level:  Active  Participation Quality:  Appropriate  Affect:  Appropriate  Cognitive:  Appropriate  Insight: Appropriate  Engagement in Group:  Engaged  Modes of Intervention:  Activity  Additional Comments:    Robin Skinner 11/08/2023, 12:54 PM

## 2023-11-08 NOTE — Progress Notes (Signed)
   11/08/23 0400  Psych Admission Type (Psych Patients Only)  Admission Status Voluntary  Psychosocial Assessment  Patient Complaints None  Eye Contact Fair  Facial Expression Other (Comment) (none)  Affect Appropriate to circumstance  Speech Logical/coherent  Interaction Assertive  Motor Activity Unsteady  Appearance/Hygiene Unremarkable  Behavior Characteristics Cooperative;Appropriate to situation  Mood Pleasant  Thought Process  Coherency WDL  Content WDL  Delusions None reported or observed  Perception WDL  Hallucination None reported or observed  Judgment WDL  Confusion None  Danger to Self  Current suicidal ideation? Denies  Agreement Not to Harm Self Yes  Description of Agreement Verbal

## 2023-11-08 NOTE — Plan of Care (Signed)
  Problem: Education: Goal: Knowledge of [facility] information/materials will improve Outcome: Progressing Goal: Emotional status will improve Outcome: Progressing Goal: Mental status will improve Outcome: Progressing Goal: Verbalization of understanding the information provided will improve Outcome: Progressing   Problem: Activity: Goal: Interest or engagement in activities will improve Outcome: Progressing   Problem: Coping: Goal: Ability to verbalize frustrations and anger appropriately will improve Outcome: Progressing Goal: Ability to demonstrate self-control will improve Outcome: Progressing

## 2023-11-09 DIAGNOSIS — F333 Major depressive disorder, recurrent, severe with psychotic symptoms: Secondary | ICD-10-CM | POA: Diagnosis not present

## 2023-11-09 MED ORDER — RISPERIDONE 1 MG PO TABS
1.0000 mg | ORAL_TABLET | ORAL | Status: DC
  Administered 2023-11-09 – 2023-11-12 (×7): 1 mg via ORAL
  Filled 2023-11-09 (×7): qty 1

## 2023-11-09 NOTE — Plan of Care (Signed)
  Problem: Activity: Goal: Interest or engagement in activities will improve Outcome: Progressing   Problem: Health Behavior/Discharge Planning: Goal: Identification of resources available to assist in meeting health care needs will improve Outcome: Progressing Goal: Compliance with treatment plan for underlying cause of condition will improve Outcome: Progressing   Problem: Safety: Goal: Periods of time without injury will increase Outcome: Progressing

## 2023-11-09 NOTE — Progress Notes (Signed)
[facility] MD Progress Note  11/09/2023 11:49 AM Robin Skinner  MRN:  [id number] Subjective: Robin Skinner is seen on rounds.  She is still having a lot of negative intrusive thoughts.  We talked about going up on her dose and she agreed.  When to go to 1 mg twice a day.  Other than that, she is able to contract for safety in the hospital.  Nurses report no issues.  She denies any side effects. Principal Problem: MDD (major depressive disorder) Diagnosis: Principal Problem:   MDD (major depressive disorder)  Total Time spent with patient: 15 minutes  Past Psychiatric History: Depression  Past Medical History:  Past Medical History:  Diagnosis Date   ADD (attention deficit disorder)    Anemia    Asthma    Bicuspid aortic valve    Depression    GERD (gastroesophageal reflux disease)    Hypothyroidism    Migraine    Shoulder impingement syndrome, right     Past Surgical History:  Procedure Laterality Date   CYSTOSCOPY N/A 02/06/2023   Procedure: CYSTOSCOPY;  Surgeon: Conard Novak, MD;  Location: [facility] ORS;  Service: Gynecology;  Laterality: N/A;   ROBOTIC ASSISTED LAPAROSCOPIC HYSTERECTOMY AND SALPINGECTOMY Bilateral 02/06/2023   Procedure: XI ROBOTIC ASSISTED LAPAROSCOPIC HYSTERECTOMY AND SALPINGECTOMY;  Surgeon: Conard Novak, MD;  Location: [facility] ORS;  Service: Gynecology;  Laterality: Bilateral;   WISDOM TOOTH EXTRACTION     Family History:  Family History  Problem Relation Age of Onset   Heart disease Maternal Grandmother    Hypertension Maternal Grandmother    Stroke Maternal Grandmother    Diabetes Paternal Grandmother    Family Psychiatric  History: Unremarkable Social History:  Social History   Substance and Sexual Activity  Alcohol Use Yes   Comment: occasionally     Social History   Substance and Sexual Activity  Drug Use No   Comment: cbd    Social History   Socioeconomic History   Marital status: Married    Spouse name: Not on file   Number of children:  Not on file   Years of education: Not on file   Highest education level: Not on file  Occupational History   Not on file  Tobacco Use   Smoking status: Never   Smokeless tobacco: Never  Vaping Use   Vaping status: Never Used  Substance and Sexual Activity   Alcohol use: Yes    Comment: occasionally   Drug use: No    Comment: cbd   Sexual activity: Not on file  Other Topics Concern   Not on file  Social History Narrative   Not on file   Social Determinants of Health   Financial Resource Strain: Medium Risk (01/11/2023)   Received from [facility], [facility]   Overall Financial Resource Strain (CARDIA)    Difficulty of Paying Living Expenses: Somewhat hard  Food Insecurity: Food Insecurity Present (11/05/2023)   Hunger Vital Sign    Worried About Running Out of Food in the Last Year: Sometimes true    Ran Out of Food in the Last Year: Never true  Transportation Needs: No Transportation Needs (11/05/2023)   PRAPARE - Administrator, Civil Service (Medical): No    Lack of Transportation (Non-Medical): No  Physical Activity: Insufficiently Active (01/11/2023)   Received from [facility], [facility]   Exercise Vital Sign    Days of Exercise per Week: 3 days  Minutes of Exercise per Session: 30 min  Stress: Stress Concern Present (01/11/2023)   Received from [facility], [facility]   [employer] of Occupational Health - Occupational Stress Questionnaire    Feeling of Stress : To some extent  Social Connections: Moderately Isolated (01/11/2023)   Received from [facility], [facility]   Social Connection and Isolation Panel [NHANES]    Frequency of Communication with Friends and Family: Once a week    Frequency of Social Gatherings with Friends and Family: Once a week    Attends Religious Services: 1 to 4 times per  year    Active Member of [employer] or Organizations: No    Attends Banker Meetings: Never    Marital Status: Married   Additional Social History:                         Sleep: Good  Appetite:  Good  Current Medications: Current Facility-Administered Medications  Medication Dose Route Frequency Provider Last Rate Last Admin   acetaminophen (TYLENOL) tablet 650 mg  650 mg Oral Q6H PRN Lauree Chandler, NP   650 mg at 11/09/23 0834   alum & mag hydroxide-simeth (MAALOX/MYLANTA) 200-200-20 MG/5ML suspension 30 mL  30 mL Oral Q4H PRN Lauree Chandler, NP   30 mL at 11/06/23 0903   buPROPion (WELLBUTRIN XL) 24 hr tablet 150 mg  150 mg Oral Daily Sarina Ill, DO   150 mg at 11/09/23 0831   dexmethylphenidate (FOCALIN XR) 24 hr capsule 10 mg  10 mg Oral Daily Sarina Ill, DO   10 mg at 11/09/23 0831   FLUoxetine (PROZAC) capsule 20 mg  20 mg Oral Daily Sarina Ill, DO   20 mg at 11/09/23 0831   ibuprofen (ADVIL) tablet 600 mg  600 mg Oral Q6H PRN Sarina Ill, DO       levothyroxine (SYNTHROID) tablet 50 mcg  50 mcg Oral Q0600 Sarina Ill, DO   50 mcg at 11/09/23 0831   linaclotide (LINZESS) capsule 290 mcg  290 mcg Oral Maida Sale, DO   290 mcg at 11/08/23 1113   LORazepam (ATIVAN) tablet 1 mg  1 mg Oral Q4H PRN Sarina Ill, DO   1 mg at 11/08/23 2116   magnesium hydroxide (MILK OF MAGNESIA) suspension 30 mL  30 mL Oral Daily PRN Lauree Chandler, NP       montelukast (SINGULAIR) tablet 10 mg  10 mg Oral QHS Sarina Ill, DO   10 mg at 11/08/23 2112   OLANZapine (ZYPREXA) tablet 10 mg  10 mg Oral Q6H PRN Sarina Ill, DO       ondansetron (ZOFRAN-ODT) disintegrating tablet 4 mg  4 mg Oral Q8H PRN Sarina Ill, DO       pantoprazole (PROTONIX) EC tablet 40 mg  40 mg Oral Daily Sarina Ill, DO   40 mg at 11/09/23 0831   pregabalin  (LYRICA) capsule 50 mg  50 mg Oral BID Sarina Ill, DO   50 mg at 11/09/23 0831   prenatal multivitamin tablet 1 tablet  1 tablet Oral Q1200 Sarina Ill, DO   1 tablet at 11/08/23 1113   risperiDONE (RISPERDAL) tablet 1 mg  1 mg Oral [id number] Deny Chevez Edward, DO       rOPINIRole (REQUIP) tablet 0.5 mg  0.5 mg Oral QHS  Sarina Ill, DO   0.5 mg at 11/08/23 2113   traZODone (DESYREL) tablet 50 mg  50 mg Oral QHS PRN Lauree Chandler, NP   50 mg at 11/08/23 2112    Lab Results:  Results for orders placed or performed during the hospital encounter of 11/05/23 (from the past 48 hour(s))  Lipid panel     Status: None   Collection Time: 11/08/23  7:08 AM  Result Value Ref Range   Cholesterol 152 0 - 200 mg/dL   Triglycerides 91 <147 mg/dL   HDL 42 >82 mg/dL   Total CHOL/HDL Ratio 3.6 RATIO   VLDL 18 0 - 40 mg/dL   LDL Cholesterol 92 0 - 99 mg/dL    Comment:        Total Cholesterol/HDL:CHD Risk Coronary Heart Disease Risk Table                     Men   Women  1/2 Average Risk   3.4   3.3  Average Risk       5.0   4.4  2 X Average Risk   9.6   7.1  3 X Average Risk  23.4   11.0        Use the calculated Patient Ratio above and the CHD Risk Table to determine the patient's CHD Risk.        ATP III CLASSIFICATION (LDL):  <100     mg/dL   Optimal  956-213  mg/dL   Near or Above                    Optimal  130-159  mg/dL   Borderline  086-578  mg/dL   High  >469     mg/dL   Very High Performed at [facility], [address] [place], [place] [address]   Hemoglobin A1c     Status: None   Collection Time: 11/08/23  7:09 AM  Result Value Ref Range   Hgb A1c MFr Bld 5.2 4.8 - 5.6 %    Comment: (NOTE) Pre diabetes:          5.7%-6.4%  Diabetes:              >6.4%  Glycemic control for   <7.0% adults with diabetes    Mean Plasma Glucose 102.54 mg/dL    Comment: Performed at [facility], [address]  [place], [place] [address]    Blood Alcohol level:  Lab Results  Component Value Date   ETH <10 11/04/2023    Metabolic Disorder Labs: Lab Results  Component Value Date   HGBA1C 5.2 11/08/2023   MPG 102.54 11/08/2023   No results found for: "PROLACTIN" Lab Results  Component Value Date   CHOL 152 11/08/2023   TRIG 91 11/08/2023   HDL 42 11/08/2023   CHOLHDL 3.6 11/08/2023   VLDL 18 11/08/2023   LDLCALC 92 11/08/2023    Physical Findings: AIMS:  , ,  ,  ,    CIWA:    COWS:     Musculoskeletal: Strength & Muscle Tone: within normal limits Gait & Station: normal Patient leans: N/A  Psychiatric Specialty Exam:  Presentation  General Appearance: No data recorded Eye Contact:No data recorded Speech:No data recorded Speech Volume:No data recorded Handedness:No data recorded  Mood and Affect  Mood:No data recorded Affect:No data recorded  Thought Process  Thought Processes:No data recorded Descriptions of Associations:No data recorded Orientation:No data  recorded Thought Content:No data recorded History of Schizophrenia/Schizoaffective disorder:No  Duration of Psychotic Symptoms:No data recorded Hallucinations:No data recorded Ideas of Reference:No data recorded Suicidal Thoughts:No data recorded Homicidal Thoughts:No data recorded  Sensorium  Memory:No data recorded Judgment:No data recorded Insight:No data recorded  Executive Functions  Concentration:No data recorded Attention Span:No data recorded Recall:No data recorded Fund of Knowledge:No data recorded Language:No data recorded  Psychomotor Activity  Psychomotor Activity:No data recorded  Assets  Assets:No data recorded  Sleep  Sleep:No data recorded    Blood pressure 97/71, pulse (!) 106, temperature 97.8 F (36.6 C), temperature source Oral, resp. rate 16, height 5\' 9"  (1.753 m), weight 119.7 kg, last menstrual period 04/06/2018, SpO2 97%. Body mass index is 38.96 kg/m.   Treatment  Plan Summary: Daily contact with patient to assess and evaluate symptoms and progress in treatment, Medication management, and Plan increase Risperdal to 1 mg twice a day.  Sarina Ill, DO 11/09/2023, 11:49 AM

## 2023-11-09 NOTE — Progress Notes (Signed)
   11/08/23 2000  Psych Admission Type (Psych Patients Only)  Admission Status Voluntary  Psychosocial Assessment  Patient Complaints None  Eye Contact Fair  Facial Expression Flat  Affect Appropriate to circumstance  Speech Logical/coherent  Interaction Assertive  Motor Activity Slow  Appearance/Hygiene Unremarkable  Behavior Characteristics Cooperative;Appropriate to situation  Mood Pleasant  Aggressive Behavior  Effect No apparent injury  Thought Process  Coherency WDL  Content WDL  Delusions None reported or observed  Perception WDL  Hallucination None reported or observed  Judgment WDL  Confusion None   Patient alert and oriented x 3, affect is flat, she denies SI/HI/AVH interacting appropriately with peers and staff no distress noted.

## 2023-11-09 NOTE — Group Note (Signed)
Date:  11/09/2023 Time:  3:52 PM  Group Topic/Focus:  Self Care:   The focus of this group is to help patients understand the importance of self-care in order to improve or restore emotional, physical, spiritual, interpersonal, and financial health.    Participation Level:  Active  Participation Quality:  Appropriate  Affect:  Appropriate  Cognitive:  Appropriate  Insight: Appropriate  Engagement in Group:  Engaged  Modes of Intervention:  Activity  Additional Comments:    Robin Skinner 11/09/2023, 3:52 PM

## 2023-11-09 NOTE — Plan of Care (Signed)
  Problem: Activity: Goal: Interest or engagement in activities will improve Outcome: Progressing   Problem: Education: Goal: Mental status will improve Outcome: Progressing

## 2023-11-09 NOTE — Group Note (Signed)
Date:  11/09/2023 Time:  8:58 PM  Group Topic/Focus:  Wrap-Up Group:   The focus of this group is to help patients review their daily goal of treatment and discuss progress on daily workbooks.    Participation Level:  Active  Participation Quality:  Appropriate, Attentive, and Supportive  Affect:  Appropriate  Cognitive:  Appropriate  Insight: Appropriate and Good  Engagement in Group:  Supportive  Modes of Intervention:  Support  Additional Comments:     Belva Crome 11/09/2023, 8:58 PM

## 2023-11-09 NOTE — Progress Notes (Addendum)
Patient appears pleasant and cooperative. Patient endorses some passive intrusive thoughts although states they have lessen in severity. Patient also endorsed thoughts of hurting others this morning but denies any actual intent. Patient denies A/V/H. Patient anxious at times and received ativan po prn for increased anxiety. Patient observed journaling while sitting outside and is med compliant. Patient socializing appropriately and remains safe on unit.    11/09/23 0835  Psych Admission Type (Psych Patients Only)  Admission Status Voluntary  Psychosocial Assessment  Patient Complaints None  Eye Contact Fair  Facial Expression Flat  Affect Appropriate to circumstance  Speech Logical/coherent  Interaction Assertive  Motor Activity Slow  Appearance/Hygiene Unremarkable  Behavior Characteristics Cooperative;Anxious  Mood Pleasant;Anxious  Thought Process  Coherency WDL  Content WDL  Delusions None reported or observed  Perception WDL  Hallucination None reported or observed  Judgment WDL  Confusion None  Danger to Self  Current suicidal ideation? Passive  Self-Injurious Behavior Some self-injurious ideation observed or expressed.  No lethal plan expressed   Agreement Not to Harm Self Yes  Description of Agreement verbal  Danger to Others  Danger to Others None reported or observed

## 2023-11-10 DIAGNOSIS — F333 Major depressive disorder, recurrent, severe with psychotic symptoms: Secondary | ICD-10-CM | POA: Diagnosis not present

## 2023-11-10 NOTE — Plan of Care (Signed)
  Problem: Education: Goal: Mental status will improve Outcome: Progressing   Problem: Education: Goal: Mental status will improve Outcome: Progressing

## 2023-11-10 NOTE — Plan of Care (Signed)
  Problem: Education: Goal: Verbalization of understanding the information provided will improve Outcome: Progressing   Problem: Activity: Goal: Interest or engagement in activities will improve Outcome: Progressing   Problem: Coping: Goal: Ability to demonstrate self-control will improve Outcome: Progressing   Problem: Health Behavior/Discharge Planning: Goal: Identification of resources available to assist in meeting health care needs will improve Outcome: Progressing Goal: Compliance with treatment plan for underlying cause of condition will improve Outcome: Progressing   Problem: Safety: Goal: Periods of time without injury will increase Outcome: Progressing

## 2023-11-10 NOTE — Progress Notes (Signed)
   11/09/23 2000  Psych Admission Type (Psych Patients Only)  Admission Status Voluntary  Psychosocial Assessment  Patient Complaints None  Eye Contact Fair  Facial Expression Flat  Affect Appropriate to circumstance  Speech Logical/coherent  Interaction Assertive  Motor Activity Slow  Appearance/Hygiene Unremarkable  Behavior Characteristics Cooperative;Appropriate to situation  Mood Pleasant;Anxious  Aggressive Behavior  Effect No apparent injury  Thought Process  Coherency WDL  Content WDL  Delusions None reported or observed  Perception WDL  Hallucination None reported or observed  Judgment WDL  Confusion None  Danger to Self  Current suicidal ideation? Denies  Self-Injurious Behavior No self-injurious ideation or behavior indicators observed or expressed   Agreement Not to Harm Self Yes  Description of Agreement verbal  Danger to Others  Danger to Others None reported or observed   Patient alert and oriented x 4, affect is flat but brightens upon approach, she denies SI/HI/AVH, 15 minutes safety checks maintained will continue to monitor.

## 2023-11-10 NOTE — Progress Notes (Signed)
[facility] MD Progress Note  11/10/2023 12:08 PM Robin Skinner  MRN:  [id number] Subjective: Robin Skinner is seen on rounds.  She tells me that she is feeling better.  We increased her dose of Risperdal yesterday.  She says that she slept well last night.  She denies any suicidal ideation.  No side effects from her medication.  Nurses report no issues. Principal Problem: MDD (major depressive disorder) Diagnosis: Principal Problem:   MDD (major depressive disorder)  Total Time spent with patient: 15 minutes  Past Psychiatric History: Depression  Past Medical History:  Past Medical History:  Diagnosis Date   ADD (attention deficit disorder)    Anemia    Asthma    Bicuspid aortic valve    Depression    GERD (gastroesophageal reflux disease)    Hypothyroidism    Migraine    Shoulder impingement syndrome, right     Past Surgical History:  Procedure Laterality Date   CYSTOSCOPY N/A 02/06/2023   Procedure: CYSTOSCOPY;  Surgeon: Conard Novak, MD;  Location: [facility] ORS;  Service: Gynecology;  Laterality: N/A;   ROBOTIC ASSISTED LAPAROSCOPIC HYSTERECTOMY AND SALPINGECTOMY Bilateral 02/06/2023   Procedure: XI ROBOTIC ASSISTED LAPAROSCOPIC HYSTERECTOMY AND SALPINGECTOMY;  Surgeon: Conard Novak, MD;  Location: [facility] ORS;  Service: Gynecology;  Laterality: Bilateral;   WISDOM TOOTH EXTRACTION     Family History:  Family History  Problem Relation Age of Onset   Heart disease Maternal Grandmother    Hypertension Maternal Grandmother    Stroke Maternal Grandmother    Diabetes Paternal Grandmother    Family Psychiatric  History: Unremarkable Social History:  Social History   Substance and Sexual Activity  Alcohol Use Yes   Comment: occasionally     Social History   Substance and Sexual Activity  Drug Use No   Comment: cbd    Social History   Socioeconomic History   Marital status: Married    Spouse name: Not on file   Number of children: Not on file   Years of education: Not on  file   Highest education level: Not on file  Occupational History   Not on file  Tobacco Use   Smoking status: Never   Smokeless tobacco: Never  Vaping Use   Vaping status: Never Used  Substance and Sexual Activity   Alcohol use: Yes    Comment: occasionally   Drug use: No    Comment: cbd   Sexual activity: Not on file  Other Topics Concern   Not on file  Social History Narrative   Not on file   Social Determinants of Health   Financial Resource Strain: Medium Risk (01/11/2023)   Received from [facility], [facility]   Overall Financial Resource Strain (CARDIA)    Difficulty of Paying Living Expenses: Somewhat hard  Food Insecurity: Food Insecurity Present (11/05/2023)   Hunger Vital Sign    Worried About Running Out of Food in the Last Year: Sometimes true    Ran Out of Food in the Last Year: Never true  Transportation Needs: No Transportation Needs (11/05/2023)   PRAPARE - Administrator, Civil Service (Medical): No    Lack of Transportation (Non-Medical): No  Physical Activity: Insufficiently Active (01/11/2023)   Received from [facility], [facility]   Exercise Vital Sign    Days of Exercise per Week: 3 days    Minutes of Exercise per Session: 30 min  Stress: Stress Concern Present (01/11/2023)  Received from [facility], [facility]   [employer] of Occupational Health - Occupational Stress Questionnaire    Feeling of Stress : To some extent  Social Connections: Moderately Isolated (01/11/2023)   Received from [facility], [facility]   Social Connection and Isolation Panel [NHANES]    Frequency of Communication with Friends and Family: Once a week    Frequency of Social Gatherings with Friends and Family: Once a week    Attends Religious Services: 1 to 4 times per year    Active Member of [employer] or  Organizations: No    Attends Engineer, structural: Never    Marital Status: Married   Additional Social History:                         Sleep: Good  Appetite:  Good  Current Medications: Current Facility-Administered Medications  Medication Dose Route Frequency Provider Last Rate Last Admin   acetaminophen (TYLENOL) tablet 650 mg  650 mg Oral Q6H PRN Lauree Chandler, NP   650 mg at 11/10/23 0810   alum & mag hydroxide-simeth (MAALOX/MYLANTA) 200-200-20 MG/5ML suspension 30 mL  30 mL Oral Q4H PRN Lauree Chandler, NP   30 mL at 11/06/23 0903   buPROPion (WELLBUTRIN XL) 24 hr tablet 150 mg  150 mg Oral Daily Sarina Ill, DO   150 mg at 11/10/23 2841   dexmethylphenidate (FOCALIN XR) 24 hr capsule 10 mg  10 mg Oral Daily Sarina Ill, DO   10 mg at 11/10/23 3244   FLUoxetine (PROZAC) capsule 20 mg  20 mg Oral Daily Sarina Ill, DO   20 mg at 11/10/23 0102   ibuprofen (ADVIL) tablet 600 mg  600 mg Oral Q6H PRN Sarina Ill, DO       levothyroxine (SYNTHROID) tablet 50 mcg  50 mcg Oral Q0600 Sarina Ill, DO   50 mcg at 11/10/23 7253   linaclotide (LINZESS) capsule 290 mcg  290 mcg Oral Maida Sale, DO   290 mcg at 11/10/23 0944   LORazepam (ATIVAN) tablet 1 mg  1 mg Oral Q4H PRN Sarina Ill, DO   1 mg at 11/09/23 1257   magnesium hydroxide (MILK OF MAGNESIA) suspension 30 mL  30 mL Oral Daily PRN Lauree Chandler, NP       montelukast (SINGULAIR) tablet 10 mg  10 mg Oral QHS Sarina Ill, DO   10 mg at 11/09/23 2141   OLANZapine (ZYPREXA) tablet 10 mg  10 mg Oral Q6H PRN Sarina Ill, DO       ondansetron (ZOFRAN-ODT) disintegrating tablet 4 mg  4 mg Oral Q8H PRN Sarina Ill, DO       pantoprazole (PROTONIX) EC tablet 40 mg  40 mg Oral Daily Sarina Ill, DO   40 mg at 11/10/23 6644   pregabalin (LYRICA) capsule 50 mg  50 mg Oral BID  Sarina Ill, DO   50 mg at 11/10/23 0347   prenatal multivitamin tablet 1 tablet  1 tablet Oral Q1200 Sarina Ill, DO   1 tablet at 11/09/23 1257   risperiDONE (RISPERDAL) tablet 1 mg  1 mg Oral BH-q8a4p Sarina Ill, DO   1 mg at 11/10/23 4259   rOPINIRole (REQUIP) tablet 0.5 mg  0.5 mg Oral QHS Sarina Ill, DO   0.5 mg at 11/09/23 2141  traZODone (DESYREL) tablet 50 mg  50 mg Oral QHS PRN Lauree Chandler, NP   50 mg at 11/08/23 2112    Lab Results: No results found for this or any previous visit (from the past 48 hour(s)).  Blood Alcohol level:  Lab Results  Component Value Date   ETH <10 11/04/2023    Metabolic Disorder Labs: Lab Results  Component Value Date   HGBA1C 5.2 11/08/2023   MPG 102.54 11/08/2023   No results found for: "PROLACTIN" Lab Results  Component Value Date   CHOL 152 11/08/2023   TRIG 91 11/08/2023   HDL 42 11/08/2023   CHOLHDL 3.6 11/08/2023   VLDL 18 11/08/2023   LDLCALC 92 11/08/2023    Physical Findings: AIMS:  , ,  ,  ,    CIWA:    COWS:     Musculoskeletal: Strength & Muscle Tone: within normal limits Gait & Station: normal Patient leans: N/A  Psychiatric Specialty Exam:  Presentation  General Appearance: No data recorded Eye Contact:No data recorded Speech:No data recorded Speech Volume:No data recorded Handedness:No data recorded  Mood and Affect  Mood:No data recorded Affect:No data recorded  Thought Process  Thought Processes:No data recorded Descriptions of Associations:No data recorded Orientation:No data recorded Thought Content:No data recorded History of Schizophrenia/Schizoaffective disorder:No  Duration of Psychotic Symptoms:No data recorded Hallucinations:No data recorded Ideas of Reference:No data recorded Suicidal Thoughts:No data recorded Homicidal Thoughts:No data recorded  Sensorium  Memory:No data recorded Judgment:No data recorded Insight:No data  recorded  Executive Functions  Concentration:No data recorded Attention Span:No data recorded Recall:No data recorded Fund of Knowledge:No data recorded Language:No data recorded  Psychomotor Activity  Psychomotor Activity:No data recorded  Assets  Assets:No data recorded  Sleep  Sleep:No data recorded    Blood pressure 130/87, pulse 93, temperature (!) 97.3 F (36.3 C), resp. rate 18, height 5\' 9"  (1.753 m), weight 119.7 kg, last menstrual period 04/06/2018, SpO2 100%. Body mass index is 38.96 kg/m.   Treatment Plan Summary: Daily contact with patient to assess and evaluate symptoms and progress in treatment, Medication management, and Plan continue current medication.  Possible discharge tomorrow.  Sarina Ill, DO 11/10/2023, 12:08 PM

## 2023-11-10 NOTE — Progress Notes (Signed)
Patient spends majority of time in dayroom socializing appropriately with other peers. Patient presents animated but anxious at times. Patient continues to endorse passive SI thoughts with no plan or intention. Currently denies HI and A/V/H. Patient has been med compliant and demonstrates adequate appetite. Patient is cooperative and pleasant in unit.

## 2023-11-10 NOTE — Group Note (Signed)
Date:  11/10/2023 Time:  8:59 PM  Group Topic/Focus:  Making Healthy Choices:   The focus of this group is to help patients identify negative/unhealthy choices they were using prior to admission and identify positive/healthier coping strategies to replace them upon discharge.    Participation Level:  Active  Participation Quality:  Appropriate and Attentive  Affect:  Appropriate  Cognitive:  Alert and Appropriate  Insight: Appropriate and Good  Engagement in Group:  Developing/Improving and Engaged  Modes of Intervention:  Activity, Discussion, Rapport Building, and Support  Additional Comments:     Aitana Burry 11/10/2023, 8:59 PM

## 2023-11-10 NOTE — Group Note (Signed)
Date:  11/10/2023 Time:  10:12 AM  Group Topic/Focus:  Goals Group:   The focus of this group is to help patients establish daily goals to achieve during treatment and discuss how the patient can incorporate goal setting into their daily lives to aide in recovery. Healthy Communication:   The focus of this group is to discuss communication, barriers to communication, as well as healthy ways to communicate with others.    Participation Level:  Active  Participation Quality:  Appropriate  Affect:  Appropriate  Cognitive:  Alert and Appropriate  Insight: Appropriate  Engagement in Group:  Developing/Improving and Engaged  Modes of Intervention:  Activity, Discussion, and Education  Additional Comments:    Terrin Meddaugh 11/10/2023, 10:12 AM

## 2023-11-10 NOTE — Group Note (Signed)
[facility] LCSW Group Therapy Note   Group Date: 11/10/2023 Start Time: 1300 End Time: 1420   Type of Therapy/Topic:  Group Therapy:  Emotion Regulation  Participation Level:  Active   Mood:  Description of Group:    The purpose of this group is to assist patients in learning to regulate negative emotions and experience positive emotions. Patients will be guided to discuss ways in which they have been vulnerable to their negative emotions. These vulnerabilities will be juxtaposed with experiences of positive emotions or situations, and patients challenged to use positive emotions to combat negative ones. Special emphasis will be placed on coping with negative emotions in conflict situations, and patients will process healthy conflict resolution skills.  Therapeutic Goals: Patient will identify two positive emotions or experiences to reflect on in order to balance out negative emotions:  Patient will label two or more emotions that they find the most difficult to experience:  Patient will be able to demonstrate positive conflict resolution skills through discussion or role plays:   Summary of Patient Progress:  The patient attended group. Patient proved open to input from peers and feedback from [facility]. The patient was respectful of peers and participated throughout the entire session. The patient participated during The Emotional Regulation BINGO game. The patient stated that singing, coloring, and playing video games helps her to relax. The patient stated that she bottles thigs up.      Marshell Levan, LCSW

## 2023-11-10 NOTE — Group Note (Signed)
Date:  11/10/2023 Time:  4:22 PM  Group Topic/Focus:  Activity Group:  The focus of this group is to promote activity for the patients and to encourage them to go outside in the courtyard to get some exercise and some fresh air.    Participation Level:  Active  Participation Quality:  Appropriate  Affect:  Appropriate  Cognitive:  Appropriate  Insight: Appropriate  Engagement in Group:  Engaged  Modes of Intervention:  Activity  Additional Comments:    Robin Skinner 11/10/2023, 4:22 PM

## 2023-11-11 DIAGNOSIS — F333 Major depressive disorder, recurrent, severe with psychotic symptoms: Secondary | ICD-10-CM | POA: Diagnosis not present

## 2023-11-11 NOTE — Plan of Care (Signed)

## 2023-11-11 NOTE — Progress Notes (Signed)
 Pt calm and pleasant during assessment denying SI/HI/AVH. Pt stated she was feeling better today and thinks her new medications are helping her. Pt given education, support, and encouragement to be active in her treatment plan. Pt being monitored Q 15 minutes for safety per unit protocol, remains safe on the unit

## 2023-11-11 NOTE — Plan of Care (Signed)
  Problem: Safety: Goal: Periods of time without injury will increase Outcome: Progressing   

## 2023-11-11 NOTE — BH IP Treatment Plan (Signed)
Interdisciplinary Treatment and Diagnostic Plan Update  11/11/2023 Time of Session: 9:00AM Robin Skinner MRN: [id number]  Principal Diagnosis: MDD (major depressive disorder)  Secondary Diagnoses: Principal Problem:   MDD (major depressive disorder)   Current Medications:  Current Facility-Administered Medications  Medication Dose Route Frequency Provider Last Rate Last Admin   acetaminophen (TYLENOL) tablet 650 mg  650 mg Oral Q6H PRN Lauree Chandler, NP   650 mg at 11/11/23 0827   alum & mag hydroxide-simeth (MAALOX/MYLANTA) 200-200-20 MG/5ML suspension 30 mL  30 mL Oral Q4H PRN Lauree Chandler, NP   30 mL at 11/06/23 0903   buPROPion (WELLBUTRIN XL) 24 hr tablet 150 mg  150 mg Oral Daily Sarina Ill, DO   150 mg at 11/11/23 8657   dexmethylphenidate (FOCALIN XR) 24 hr capsule 10 mg  10 mg Oral Daily Sarina Ill, DO   10 mg at 11/11/23 8469   FLUoxetine (PROZAC) capsule 20 mg  20 mg Oral Daily Sarina Ill, DO   20 mg at 11/11/23 6295   ibuprofen (ADVIL) tablet 600 mg  600 mg Oral Q6H PRN Sarina Ill, DO       levothyroxine (SYNTHROID) tablet 50 mcg  50 mcg Oral Q0600 Sarina Ill, DO   50 mcg at 11/11/23 2841   linaclotide (LINZESS) capsule 290 mcg  290 mcg Oral Maida Sale, DO   290 mcg at 11/10/23 0944   LORazepam (ATIVAN) tablet 1 mg  1 mg Oral Q4H PRN Sarina Ill, DO   1 mg at 11/09/23 1257   magnesium hydroxide (MILK OF MAGNESIA) suspension 30 mL  30 mL Oral Daily PRN Lauree Chandler, NP       montelukast (SINGULAIR) tablet 10 mg  10 mg Oral QHS Sarina Ill, DO   10 mg at 11/10/23 2120   OLANZapine (ZYPREXA) tablet 10 mg  10 mg Oral Q6H PRN Sarina Ill, DO       ondansetron (ZOFRAN-ODT) disintegrating tablet 4 mg  4 mg Oral Q8H PRN Sarina Ill, DO       pantoprazole (PROTONIX) EC tablet 40 mg  40 mg Oral Daily Sarina Ill, DO   40 mg  at 11/11/23 3244   pregabalin (LYRICA) capsule 50 mg  50 mg Oral BID Sarina Ill, DO   50 mg at 11/11/23 0102   prenatal multivitamin tablet 1 tablet  1 tablet Oral Q1200 Sarina Ill, DO   1 tablet at 11/10/23 1243   risperiDONE (RISPERDAL) tablet 1 mg  1 mg Oral [id number] Sarina Ill, DO   1 mg at 11/11/23 7253   rOPINIRole (REQUIP) tablet 0.5 mg  0.5 mg Oral QHS Sarina Ill, DO   0.5 mg at 11/10/23 2120   traZODone (DESYREL) tablet 50 mg  50 mg Oral QHS PRN Lauree Chandler, NP   50 mg at 11/08/23 2112   PTA Medications: Medications Prior to Admission  Medication Sig Dispense Refill Last Dose   albuterol (VENTOLIN HFA) 108 (90 Base) MCG/ACT inhaler Inhale 2 puffs into the lungs every 4 (four) hours as needed for wheezing or shortness of breath.   prn at unk   buPROPion (WELLBUTRIN XL) 150 MG 24 hr tablet Take 150 mg by mouth daily.   11/04/2023   busPIRone (BUSPAR) 7.5 MG tablet Take 7.5 mg by mouth 3 (three) times daily.   11/04/2023   Cholecalciferol (VITAMIN D-3 PO) Take 2,000 Units by mouth daily.  11/04/2023   cyanocobalamin (VITAMIN B12) 500 MCG tablet Take 1,000 mcg by mouth daily.   11/04/2023   dexmethylphenidate (FOCALIN XR) 10 MG 24 hr capsule Take 10 mg by mouth in the morning.   11/04/2023   escitalopram (LEXAPRO) 10 MG tablet Take 10 mg by mouth daily.   11/04/2023   escitalopram (LEXAPRO) 5 MG tablet Take 5 mg by mouth daily.   11/04/2023   levothyroxine (SYNTHROID) 50 MCG tablet Take 50 mcg by mouth daily before breakfast.   11/04/2023   magnesium oxide (MAG-OX) 400 (240 Mg) MG tablet Take 250 mg by mouth 2 (two) times daily.   11/04/2023   Naltrexone HCl, Pain, (NALTREX) 4.5 MG CAPS Take 4.5 mg by mouth daily.   11/04/2023   omeprazole (PRILOSEC) 40 MG capsule Take 40 mg by mouth in the morning and at bedtime.   11/04/2023   ondansetron (ZOFRAN ODT) 4 MG disintegrating tablet Take 1 tablet (4 mg total) by mouth every 8 (eight) hours as  needed. 15 tablet 0 11/04/2023   pregabalin (LYRICA) 50 MG capsule Take 50 mg by mouth 2 (two) times daily.   11/04/2023   rOPINIRole (REQUIP) 0.5 MG tablet Take 0.5 mg by mouth at bedtime.   11/04/2023   saccharomyces boulardii (FLORASTOR) 250 MG capsule Take 250 mg by mouth 2 (two) times daily.   11/04/2023   triamcinolone ointment (KENALOG) 0.5 % Apply 1 Application topically. In both ear canals once nightly x 1 months, once weekly thereafter   prn at unk   ascorbic acid (VITAMIN C) 500 MG tablet Take 500 mg by mouth 2 (two) times daily. (Patient not taking: Reported on 11/05/2023)   Not Taking   dexmethylphenidate (FOCALIN) 10 MG tablet Take 10 mg by mouth at bedtime as needed.      escitalopram (LEXAPRO) 20 MG tablet Take 20 mg by mouth daily. (Patient not taking: Reported on 11/05/2023)   Not Taking   estradiol (ESTRACE) 1 MG tablet Take 1 mg by mouth daily. (Patient not taking: Reported on 11/05/2023)   Not Taking   ibuprofen (ADVIL) 600 MG tablet Take 1 tablet (600 mg total) by mouth every 6 (six) hours. (Patient not taking: Reported on 11/05/2023) 30 tablet 0 Not Taking   linaclotide (LINZESS) 290 MCG CAPS capsule Take 290 mcg by mouth every other day.   11/03/2023   meclizine (ANTIVERT) 25 MG tablet Take 1 tablet (25 mg total) by mouth 3 (three) times daily as needed for dizziness. (Patient not taking: Reported on 11/05/2023) 15 tablet 0 Not Taking   montelukast (SINGULAIR) 10 MG tablet Take 10 mg by mouth at bedtime. (Patient not taking: Reported on 11/05/2023)   Not Taking   ondansetron (ZOFRAN-ODT) 4 MG disintegrating tablet Take 1 tablet (4 mg total) by mouth every 6 (six) hours as needed for nausea. 20 tablet 0     Patient Stressors: Financial difficulties    Patient Strengths: General fund of knowledge  Supportive family/friends  Work skills   Treatment Modalities: Medication Management, Group therapy, Case management,  1 to 1 session with clinician, Psychoeducation, Recreational  therapy.   Physician Treatment Plan for Primary Diagnosis: MDD (major depressive disorder) Long Term Goal(s): Improvement in symptoms so as ready for discharge   Short Term Goals: Ability to identify changes in lifestyle to reduce recurrence of condition will improve Ability to verbalize feelings will improve Ability to disclose and discuss suicidal ideas Ability to demonstrate self-control will improve Ability to identify and develop effective coping  behaviors will improve Ability to maintain clinical measurements within normal limits will improve Compliance with prescribed medications will improve Ability to identify triggers associated with substance abuse/mental health issues will improve  Medication Management: Evaluate patient's response, side effects, and tolerance of medication regimen.  Therapeutic Interventions: 1 to 1 sessions, Unit Group sessions and Medication administration.  Evaluation of Outcomes: Progressing  Physician Treatment Plan for Secondary Diagnosis: Principal Problem:   MDD (major depressive disorder)  Long Term Goal(s): Improvement in symptoms so as ready for discharge   Short Term Goals: Ability to identify changes in lifestyle to reduce recurrence of condition will improve Ability to verbalize feelings will improve Ability to disclose and discuss suicidal ideas Ability to demonstrate self-control will improve Ability to identify and develop effective coping behaviors will improve Ability to maintain clinical measurements within normal limits will improve Compliance with prescribed medications will improve Ability to identify triggers associated with substance abuse/mental health issues will improve     Medication Management: Evaluate patient's response, side effects, and tolerance of medication regimen.  Therapeutic Interventions: 1 to 1 sessions, Unit Group sessions and Medication administration.  Evaluation of Outcomes: Progressing   RN  Treatment Plan for Primary Diagnosis: MDD (major depressive disorder) Long Term Goal(s): Knowledge of disease and therapeutic regimen to maintain health will improve  Short Term Goals: Ability to demonstrate self-control, Ability to participate in decision making will improve, Ability to verbalize feelings will improve, Ability to disclose and discuss suicidal ideas, Ability to identify and develop effective coping behaviors will improve, and Compliance with prescribed medications will improve  Medication Management: RN will administer medications as ordered by provider, will assess and evaluate patient's response and provide education to patient for prescribed medication. RN will report any adverse and/or side effects to prescribing provider.  Therapeutic Interventions: 1 on 1 counseling sessions, Psychoeducation, Medication administration, Evaluate responses to treatment, Monitor vital signs and CBGs as ordered, Perform/monitor CIWA, COWS, AIMS and Fall Risk screenings as ordered, Perform wound care treatments as ordered.  Evaluation of Outcomes: Progressing   LCSW Treatment Plan for Primary Diagnosis: MDD (major depressive disorder) Long Term Goal(s): Safe transition to appropriate next level of care at discharge, Engage patient in therapeutic group addressing interpersonal concerns.  Short Term Goals: Engage patient in aftercare planning with referrals and resources, Increase social support, Increase ability to appropriately verbalize feelings, Increase emotional regulation, Facilitate acceptance of mental health diagnosis and concerns, and Increase skills for wellness and recovery  Therapeutic Interventions: Assess for all discharge needs, 1 to 1 time with Social worker, Explore available resources and support systems, Assess for adequacy in community support network, Educate family and significant other(s) on suicide prevention, Complete Psychosocial Assessment, Interpersonal group  therapy.  Evaluation of Outcomes: Progressing   Progress in Treatment: Attending groups: Yes. Participating in groups: Yes. Taking medication as prescribed: Yes. Toleration medication: Yes. Family/Significant other contact made: No, will contact:  once permission has been given. Patient understands diagnosis: Yes. Discussing patient identified problems/goals with staff: Yes. Medical problems stabilized or resolved: Yes. Denies suicidal/homicidal ideation: Yes. Issues/concerns per patient self-inventory: No. Other: none  New problem(s) identified: No, Describe:  none  New Short Term/Long Term Goal(s):detox, elimination of symptoms of psychosis, medication management for mood stabilization; elimination of SI thoughts; development of comprehensive mental wellness/sobriety plan.   Update 11/11/2023:  No changes at this time.    Patient Goals:  "I want to control the disassociation that has been happening." Update 11/11/2023:  No changes at this time.  Discharge Plan or Barriers: CSW to assist patient in the development of appropriate discharge plan. Update 11/11/2023:  Patient continues to report suicidal symptoms off and on.  The physician is altering the patients medications at this time.  Patient has asked for assistance with her financial situation, CSW has reached out to leadership and is awaiting additional information.     Reason for Continuation of Hospitalization: Anxiety Delusions  Depression Hallucinations Homicidal ideation Mania Suicidal ideation   Estimated Length of Stay: 1-7 days. Update 11/11/2023:  TBD   Last 3 [place] Suicide Severity Risk Score: Flowsheet Row Admission (Current) from 11/05/2023 in [facility] ED from 11/04/2023 in [facility] Emergency Department at [facility] Admission (Discharged) from 02/06/2023 in [facility]  C-SSRS RISK CATEGORY Moderate Risk High Risk No Risk        Last PHQ 2/9 Scores:    01/21/2018    3:46 PM  Depression screen PHQ 2/9  Decreased Interest 0  Down, Depressed, Hopeless 0  PHQ - 2 Score 0  Altered sleeping 0  Tired, decreased energy 0  Change in appetite 0  Feeling bad or failure about yourself  0  Trouble concentrating 0  Moving slowly or fidgety/restless 0  Suicidal thoughts 0  PHQ-9 Score 0    Scribe for Treatment Team: Harden Mo, Alexander Mt 11/11/2023 12:42 PM

## 2023-11-11 NOTE — Group Note (Signed)
Date:  11/11/2023 Time:  10:21 AM  Group Topic/Focus:  Dimensions of Wellness:   The focus of this group is to introduce the topic of wellness and discuss the role each dimension of wellness plays in total health. Goals Group:   The focus of this group is to help patients establish daily goals to achieve during treatment and discuss how the patient can incorporate goal setting into their daily lives to aide in recovery.    Participation Level:  Active  Participation Quality:  Appropriate  Affect:  Appropriate  Cognitive:  Alert, Appropriate, and Oriented  Insight: Appropriate  Engagement in Group:  Developing/Improving and Engaged  Modes of Intervention:  Activity, Discussion, and Education  Additional Comments:    Rosaura Carpenter 11/11/2023, 10:21 AM

## 2023-11-11 NOTE — Progress Notes (Signed)
D- Patient alert and oriented. Patient presented in a pleasant mood on assessment reporting that she slept good last night and had some complaints of right shoulder pain. Patient rated her pain a "1/10", in which she did request PRN pain medication to help with relief. Patient denied SI, HI, AVH at this time. Patient also denied signs/symptoms of depression and anxiety. Patient's goal for today is to "focus on what is needed to be discharged and what is next", in which she will "journal about my personal ideas around discharge and ways to use coping skills", in order to achieve her goal.  A- Scheduled medications administered to patient, per MD orders. Support and encouragement provided. Routine safety checks conducted every 15 minutes. Patient informed to notify staff with problems or concerns.  R- No adverse drug reactions noted. Patient contracts for safety at this time. Patient compliant with medications and treatment plan. Patient receptive, calm, and cooperative. Patient interacts well with others on the unit. Patient remains safe at this time.   11/11/23 0830  Psych Admission Type (Psych Patients Only)  Admission Status Voluntary  Psychosocial Assessment  Patient Complaints None  Eye Contact Fair  Facial Expression Sullen  Affect Appropriate to circumstance  Speech Logical/coherent  Interaction Assertive  Motor Activity Slow  Appearance/Hygiene Unremarkable  Behavior Characteristics Cooperative;Appropriate to situation  Mood Pleasant  Aggressive Behavior  Effect No apparent injury  Thought Process  Coherency Circumstantial  Content WDL  Delusions None reported or observed  Perception WDL  Hallucination None reported or observed  Judgment WDL  Confusion None  Danger to Self  Current suicidal ideation? Denies  Self-Injurious Behavior No self-injurious ideation or behavior indicators observed or expressed   Agreement Not to Harm Self Yes  Description of Agreement Verbal  Danger  to Others  Danger to Others None reported or observed

## 2023-11-11 NOTE — Group Note (Signed)
Date:  11/11/2023 Time:  10:02 PM  Group Topic/Focus:  Wrap-Up Group:   The focus of this group is to help patients review their daily goal of treatment and discuss progress on daily workbooks.    Participation Level:  Minimal  Participation Quality:  Appropriate  Affect:  Appropriate  Cognitive:  Alert  Insight: Appropriate  Engagement in Group:  Engaged  Modes of Intervention:  Discussion  Additional Comments:     Maglione,Brier Firebaugh E 11/11/2023, 10:02 PM

## 2023-11-11 NOTE — Group Note (Signed)
Date:  11/11/2023 Time:  6:08 PM  Group Topic/Focus:  OUTDOOR RECREATION STRUCTURED ACTIVITY    Participation Level:  Active  Participation Quality:  Appropriate  Affect:  Appropriate  Cognitive:  Alert, Appropriate, and Oriented  Insight: Appropriate  Engagement in Group:  Developing/Improving, Engaged, and Supportive  Modes of Intervention:  Activity, Discussion, and Socialization  Additional Comments:    Rosaura Carpenter 11/11/2023, 6:08 PM

## 2023-11-11 NOTE — Progress Notes (Signed)
D: Pt alert and oriented. Pt denies experiencing any anxiety/depression at time of assessment. Pt denies experiencing any pain. Pt denies experiencing any SI/HI, or AVH, reports mood as "melancholy."    A: Scheduled medications administered to pt, per MD orders. Support and encouragement provided. Frequent verbal contact made. Routine safety checks conducted q15 minutes.   R: No adverse drug reactions noted. Pt verbally contracts for safety at this time. Pt complaint with medications. Pt interacts appropriately with others on the unit. Pt remains safe at this time.

## 2023-11-11 NOTE — Progress Notes (Signed)
[facility] MD Progress Note  11/11/2023 1:54 PM Robin Skinner  MRN:  [id number] Subjective: Robin Skinner is seen on rounds.  This is her second day in a row that she says that she is doing well.  I increased her Risperdal to 1 mg twice a day which has helped.  She says that her mood is much better.  She is currently seeing a psychiatrist at [facility] through telepsych.  I am not sure what her next appointment is.  If she has a good day today and tomorrow she should be able to be discharged soon. Principal Problem: MDD (major depressive disorder) Diagnosis: Principal Problem:   MDD (major depressive disorder)  Total Time spent with patient: 15 minutes  Past Psychiatric History: Depression, anxiety  Past Medical History:  Past Medical History:  Diagnosis Date   ADD (attention deficit disorder)    Anemia    Asthma    Bicuspid aortic valve    Depression    GERD (gastroesophageal reflux disease)    Hypothyroidism    Migraine    Shoulder impingement syndrome, right     Past Surgical History:  Procedure Laterality Date   CYSTOSCOPY N/A 02/06/2023   Procedure: CYSTOSCOPY;  Surgeon: Conard Novak, MD;  Location: [facility] ORS;  Service: Gynecology;  Laterality: N/A;   ROBOTIC ASSISTED LAPAROSCOPIC HYSTERECTOMY AND SALPINGECTOMY Bilateral 02/06/2023   Procedure: XI ROBOTIC ASSISTED LAPAROSCOPIC HYSTERECTOMY AND SALPINGECTOMY;  Surgeon: Conard Novak, MD;  Location: [facility] ORS;  Service: Gynecology;  Laterality: Bilateral;   WISDOM TOOTH EXTRACTION     Family History:  Family History  Problem Relation Age of Onset   Heart disease Maternal Grandmother    Hypertension Maternal Grandmother    Stroke Maternal Grandmother    Diabetes Paternal Grandmother    Family Psychiatric  History: Unremarkable Social History:  Social History   Substance and Sexual Activity  Alcohol Use Yes   Comment: occasionally     Social History   Substance and Sexual Activity  Drug Use No   Comment:  cbd    Social History   Socioeconomic History   Marital status: Married    Spouse name: Not on file   Number of children: Not on file   Years of education: Not on file   Highest education level: Not on file  Occupational History   Not on file  Tobacco Use   Smoking status: Never   Smokeless tobacco: Never  Vaping Use   Vaping status: Never Used  Substance and Sexual Activity   Alcohol use: Yes    Comment: occasionally   Drug use: No    Comment: cbd   Sexual activity: Not on file  Other Topics Concern   Not on file  Social History Narrative   Not on file   Social Determinants of Health   Financial Resource Strain: Medium Risk (01/11/2023)   Received from [facility], [facility]   Overall Financial Resource Strain (CARDIA)    Difficulty of Paying Living Expenses: Somewhat hard  Food Insecurity: Food Insecurity Present (11/05/2023)   Hunger Vital Sign    Worried About Running Out of Food in the Last Year: Sometimes true    Ran Out of Food in the Last Year: Never true  Transportation Needs: No Transportation Needs (11/05/2023)   PRAPARE - Administrator, Civil Service (Medical): No    Lack of Transportation (Non-Medical): No  Physical Activity: Insufficiently Active (01/11/2023)   [facility] from [facility], [facility]   Exercise Vital Sign    Days of Exercise per Week: 3 days    Minutes of Exercise per Session: 30 min  Stress: Stress Concern Present (01/11/2023)   Received from [facility], [facility]   [employer] of Occupational Health - Occupational Stress Questionnaire    Feeling of Stress : To some extent  Social Connections: Moderately Isolated (01/11/2023)   Received from [facility], [facility]   Social Connection and Isolation Panel [NHANES]    Frequency of Communication with Friends and Family: Once a  week    Frequency of Social Gatherings with Friends and Family: Once a week    Attends Religious Services: 1 to 4 times per year    Active Member of [employer] or Organizations: No    Attends Engineer, structural: Never    Marital Status: Married   Additional Social History:                         Sleep: Good  Appetite:  Good  Current Medications: Current Facility-Administered Medications  Medication Dose Route Frequency Provider Last Rate Last Admin   acetaminophen (TYLENOL) tablet 650 mg  650 mg Oral Q6H PRN Lauree Chandler, NP   650 mg at 11/11/23 0827   alum & mag hydroxide-simeth (MAALOX/MYLANTA) 200-200-20 MG/5ML suspension 30 mL  30 mL Oral Q4H PRN Lauree Chandler, NP   30 mL at 11/06/23 0903   buPROPion (WELLBUTRIN XL) 24 hr tablet 150 mg  150 mg Oral Daily Sarina Ill, DO   150 mg at 11/11/23 5366   dexmethylphenidate (FOCALIN XR) 24 hr capsule 10 mg  10 mg Oral Daily Sarina Ill, DO   10 mg at 11/11/23 4403   FLUoxetine (PROZAC) capsule 20 mg  20 mg Oral Daily Sarina Ill, DO   20 mg at 11/11/23 4742   ibuprofen (ADVIL) tablet 600 mg  600 mg Oral Q6H PRN Sarina Ill, DO       levothyroxine (SYNTHROID) tablet 50 mcg  50 mcg Oral Q0600 Sarina Ill, DO   50 mcg at 11/11/23 5956   linaclotide (LINZESS) capsule 290 mcg  290 mcg Oral Maida Sale, DO   290 mcg at 11/10/23 0944   LORazepam (ATIVAN) tablet 1 mg  1 mg Oral Q4H PRN Sarina Ill, DO   1 mg at 11/09/23 1257   magnesium hydroxide (MILK OF MAGNESIA) suspension 30 mL  30 mL Oral Daily PRN Lauree Chandler, NP       montelukast (SINGULAIR) tablet 10 mg  10 mg Oral QHS Sarina Ill, DO   10 mg at 11/10/23 2120   OLANZapine (ZYPREXA) tablet 10 mg  10 mg Oral Q6H PRN Sarina Ill, DO       ondansetron (ZOFRAN-ODT) disintegrating tablet 4 mg  4 mg Oral Q8H PRN Sarina Ill, DO        pantoprazole (PROTONIX) EC tablet 40 mg  40 mg Oral Daily Sarina Ill, DO   40 mg at 11/11/23 3875   pregabalin (LYRICA) capsule 50 mg  50 mg Oral BID Sarina Ill, DO   50 mg at 11/11/23 6433   prenatal multivitamin tablet 1 tablet  1 tablet Oral Q1200 Sarina Ill, DO   1 tablet at 11/11/23 1252   risperiDONE (RISPERDAL) tablet 1 mg  1 mg Oral BH-q8a4p Sarina Ill, DO   1 mg at 11/11/23 8119   rOPINIRole (REQUIP) tablet 0.5 mg  0.5 mg Oral QHS Sarina Ill, DO   0.5 mg at 11/10/23 2120   traZODone (DESYREL) tablet 50 mg  50 mg Oral QHS PRN Lauree Chandler, NP   50 mg at 11/08/23 2112    Lab Results: No results found for this or any previous visit (from the past 48 hour(s)).  Blood Alcohol level:  Lab Results  Component Value Date   ETH <10 11/04/2023    Metabolic Disorder Labs: Lab Results  Component Value Date   HGBA1C 5.2 11/08/2023   MPG 102.54 11/08/2023   No results found for: "PROLACTIN" Lab Results  Component Value Date   CHOL 152 11/08/2023   TRIG 91 11/08/2023   HDL 42 11/08/2023   CHOLHDL 3.6 11/08/2023   VLDL 18 11/08/2023   LDLCALC 92 11/08/2023    Physical Findings: AIMS:  , ,  ,  ,    CIWA:    COWS:     Musculoskeletal: Strength & Muscle Tone: within normal limits Gait & Station: normal Patient leans: N/A  Psychiatric Specialty Exam:  Presentation  General Appearance: No data recorded Eye Contact:No data recorded Speech:No data recorded Speech Volume:No data recorded Handedness:No data recorded  Mood and Affect  Mood:No data recorded Affect:No data recorded  Thought Process  Thought Processes:No data recorded Descriptions of Associations:No data recorded Orientation:No data recorded Thought Content:No data recorded History of Schizophrenia/Schizoaffective disorder:No  Duration of Psychotic Symptoms:No data recorded Hallucinations:No data recorded Ideas of Reference:No data  recorded Suicidal Thoughts:No data recorded Homicidal Thoughts:No data recorded  Sensorium  Memory:No data recorded Judgment:No data recorded Insight:No data recorded  Executive Functions  Concentration:No data recorded Attention Span:No data recorded Recall:No data recorded Fund of Knowledge:No data recorded Language:No data recorded  Psychomotor Activity  Psychomotor Activity:No data recorded  Assets  Assets:No data recorded  Sleep  Sleep:No data recorded   MENTAL STATUS EXAM: Patient is alert and oriented x 3, pleasant and cooperative, good eye contact, speech is normal and not pressured, mood is euthymic; affect is congruent; thought process: goal directed; thought content: No more suicidal ideation; judgment is fair, insight is good. Blood pressure 126/81, pulse 88, temperature (!) 97.2 F (36.2 C), resp. rate 17, height 5\' 9"  (1.753 m), weight 119.7 kg, last menstrual period 04/06/2018, SpO2 100%. Body mass index is 38.96 kg/m.   Treatment Plan Summary: Daily contact with patient to assess and evaluate symptoms and progress in treatment, Medication management, and Plan continue current medications.  Sarina Ill, DO 11/11/2023, 1:54 PM

## 2023-11-12 DIAGNOSIS — F324 Major depressive disorder, single episode, in partial remission: Secondary | ICD-10-CM

## 2023-11-12 MED ORDER — ONDANSETRON 4 MG PO TBDP: 4.0000 mg | ORAL_TABLET | Freq: Three times a day (TID) | ORAL | 0 refills | Status: AC | PRN

## 2023-11-12 MED ORDER — PRENATAL MULTIVITAMIN CH: 1.0000 | ORAL_TABLET | Freq: Every day | ORAL | 0 refills | Status: AC

## 2023-11-12 MED ORDER — RISPERIDONE 1 MG PO TABS: 1.0000 mg | ORAL_TABLET | Freq: Two times a day (BID) | ORAL | 0 refills | Status: AC

## 2023-11-12 MED ORDER — DEXMETHYLPHENIDATE HCL ER 10 MG PO CP24: 10.0000 mg | ORAL_CAPSULE | Freq: Every day | ORAL | 0 refills | Status: AC

## 2023-11-12 MED ORDER — PANTOPRAZOLE SODIUM 40 MG PO TBEC: 40.0000 mg | DELAYED_RELEASE_TABLET | Freq: Every day | ORAL | 0 refills | Status: AC

## 2023-11-12 MED ORDER — TRAZODONE HCL 50 MG PO TABS: 50.0000 mg | ORAL_TABLET | Freq: Every evening | ORAL | 0 refills | Status: AC | PRN

## 2023-11-12 MED ORDER — LINACLOTIDE 290 MCG PO CAPS: 290.0000 ug | ORAL_CAPSULE | ORAL | 0 refills | Status: AC

## 2023-11-12 MED ORDER — FLUOXETINE HCL 20 MG PO CAPS: 20.0000 mg | ORAL_CAPSULE | Freq: Every day | ORAL | 0 refills | Status: AC

## 2023-11-12 MED ORDER — LEVOTHYROXINE SODIUM 50 MCG PO TABS: 50.0000 ug | ORAL_TABLET | Freq: Every day | ORAL | 0 refills | Status: AC

## 2023-11-12 NOTE — Progress Notes (Signed)
  [facility] Adult Case Management Discharge Plan :  Will you be returning to the same living situation after discharge:  Yes,  Patient will be returning home.  At discharge, do you have transportation home?: Yes,  Patient's husband will be providing transportation at discharge.  Do you have the ability to pay for your medications: [facility] / [facility]   Release of information consent forms completed and in the chart;  Patient's signature needed at discharge.  Patient to Follow up at:  Follow-up Information     [facility].   Why: 11/20/23 11AM psychiatry telehealth appointment. Contact information: [address] [place] [address] [phone]         Llc, [facility]. Go to.   Why: Appointment is Monday, November 18th at [facility] information: [address] [place] [address] [phone]                 Next level of care provider has access to [facility] Link:no  Safety Planning and Suicide Prevention discussed: Yes,  SPE conducted with patient. SP pamphlet provided to patient.      Has patient been referred to the Quitline?: Patient does not use tobacco/nicotine products  Patient has been referred for addiction treatment: No known substance use disorder.  Lowry Ram, LCSW 11/12/2023, 10:13 AM

## 2023-11-12 NOTE — Progress Notes (Signed)
Patient is discharging at this time. Patient is A&Ox4. Stable. Patient denies SI,HI, and A/V/H with no plan/intent. Printed AVS reviewed with and given to patient along with medications and follow up appointments. Suicide safety plan complete with copy provided to patient.Original form in chart.  Survey complete.Patient verbalized all understanding. All valuables/belongings returned to patient. Patient is being transported by her husband. Patient denies any pain/discomfort. No s/s of current distress.

## 2023-11-12 NOTE — [facility]HH Suicide Risk Assessment (Signed)
[facility] Discharge Suicide Risk Assessment   Principal Problem: MDD (major depressive disorder) Discharge Diagnoses: Principal Problem:   MDD (major depressive disorder)   Total Time spent with patient: 1 hour  Musculoskeletal: Strength & Muscle Tone: within normal limits Gait & Station: normal Patient leans: N/A  Psychiatric Specialty Exam  Presentation  General Appearance: Appropriate for Environment  Eye Contact:Good  Speech:Clear and Coherent; Normal Rate  Speech Volume:No data recorded Handedness:Right   Mood and Affect  Mood:Euthymic  Duration of Depression Symptoms: Greater than two weeks  Affect:Appropriate; Congruent   Thought Process  Thought Processes:Coherent  Descriptions of Associations:Intact  Orientation:Full (Time, Place and Person) (and situation)  Thought Content:WDL  History of Schizophrenia/Schizoaffective disorder:No  Duration of Psychotic Symptoms: none noted Hallucinations:Hallucinations: None  Ideas of Reference:None  Suicidal Thoughts:Suicidal Thoughts: No  Homicidal Thoughts:Homicidal Thoughts: No   Sensorium  Memory:Immediate Good; Remote Good  Judgment:Good  Insight:Good   Executive Functions  Concentration:Good  Attention Span:Good  Recall:Good  Fund of Knowledge:Good  Language:Good   Psychomotor Activity  Psychomotor Activity:Psychomotor Activity: Normal   Assets  Assets:Communication Skills; Desire for Improvement; Financial Resources/Insurance; Resilience   Sleep  Sleep:Sleep: Good Number of Hours of Sleep: 7   Physical Exam: Physical Exam Vitals and nursing note reviewed.  Constitutional:      Appearance: Normal appearance.  HENT:     Head: Normocephalic and atraumatic.     Nose: Nose normal.  Pulmonary:     Effort: Pulmonary effort is normal.  Musculoskeletal:        General: Normal range of motion.     Cervical back: Normal range of motion.  Neurological:     General: No focal  deficit present.     Mental Status: She is alert and oriented to person, place, and time.  Psychiatric:        Attention and Perception: Attention and perception normal.        Mood and Affect: Mood and affect normal.        Speech: Speech normal.        Behavior: Behavior normal. Behavior is cooperative.        Thought Content: Thought content normal.        Cognition and Memory: Cognition and memory normal.        Judgment: Judgment normal.    ROS Blood pressure (!) 130/95, pulse (!) 102, temperature (!) 97.5 F (36.4 C), temperature source Oral, resp. rate 18, height 5\' 9"  (1.753 m), weight 119.7 kg, last menstrual period 04/06/2018, SpO2 100%. Body mass index is 38.96 kg/m.  Mental Status Per Nursing Assessment::   On Admission:  Self-harm thoughts, Thoughts of violence towards others  Demographic Factors:  Caucasian and Low socioeconomic status  Loss Factors: Decrease in vocational status and Financial problems/change in socioeconomic status  Historical Factors: Impulsivity  Risk Reduction Factors:   Sense of responsibility to family, Living with another person, especially a relative, Positive social support, Positive therapeutic relationship, and Positive coping skills or problem solving skills  Continued Clinical Symptoms:  Depression:   Anhedonia Impulsivity Insomnia More than one psychiatric diagnosis  Cognitive Features That Contribute To Risk:  None    Suicide Risk:  Minimal: No identifiable suicidal ideation.  Patients presenting with no risk factors but with morbid ruminations; may be classified as minimal risk based on the severity of the depressive symptoms   Follow-up Information     [facility].   Why: 11/20/23 11AM psychiatry telehealth appointment. Contact information: [address] [place] [address] [phone]         Llc, [facility]. Go to.   Why: Appointment is Monday, November 18th at  [facility] information: [address] [place] [address] [phone]                 Plan Of Care/Follow-up recommendations:  Activity:  as tolerated Diet:  heart healthy Lexapro (escitalopram) - 15 mg once daily for depression and anxiety. Buspar (buspirone) - 7.5 mg twice daily for anxiety. Risperdal (risperidone) - 1 mg twice daily to manage intrusive thoughts and mood stability. Methylphenidate - as prescribed, for ADHD management. Trazodone - 50 mg at bedtime as needed for insomnia. Crisis Hotline Access: 988 Suicide & Crisis Lifeline available 24/7 for immediate support Purpose: Psychiatry Telehealth Appointment Date/Time: November 20, 2023, at 11:00 AM Location: [address], [place], [place] [address] Contact: [phone] [facility]: Behavioral Health Appointment Date/Time: November 18, 2023, at 10:00 AM Location: [address], [place], [place] [address] Contact: [phone]   Myriam Forehand, NP 11/12/2023, 2:29 PM

## 2023-11-12 NOTE — Group Note (Signed)
Recreation Therapy Group Note   Group Topic:Coping Skills  Group Date: 11/12/2023 Start Time: 1000 End Time: 1055 Facilitators: Rosina Lowenstein, LRT, CTRS Location:  Craft Room  Group Description: Mind Map.  Patient was provided a blank template of a diagram with 32 blank boxes in a tiered system, branching from the center (similar to a bubble chart). LRT directed patients to label the middle of the diagram "Coping Skills". LRT and patients then came up with 8 different coping skills as examples. Pt were directed to record their coping skills in the 2nd tier boxes closest to the center.  Patients would then share their coping skills with the group as LRT wrote them out. LRT gave a handout of 99 different coping skills at the end of group.   Goal Area(s) Addressed: Patients will be able to define "coping skills". Patient will identify new coping skills.  Patient will increase communication.   Affect/Mood: Appropriate   Participation Level: Active and Engaged   Participation Quality: Independent   Behavior: Appropriate, Calm, and Cooperative   Speech/Thought Process: Coherent   Insight: Good   Judgement: Good   Modes of Intervention: Worksheet   Patient Response to Interventions:  Attentive, Engaged, Interested , and Receptive   Education Outcome:  Acknowledges education   Clinical Observations/Individualized Feedback: Robin Skinner was active in their participation of session activities and group discussion. Pt identified "PMR, breathing, drawing, crocheting, and pets" as coping skills. Pt interacted well with LRT and peers duration of session.    Plan: Continue to engage patient in RT group sessions 2-3x/week.   Rosina Lowenstein, LRT, CTRS 11/12/2023 12:37 PM

## 2023-11-12 NOTE — Discharge Summary (Addendum)
Physician Discharge Summary Note  Patient:  Robin Skinner is an 32 y.o., female MRN:  [id number] DOB:  1991/06/04 Patient phone:  [phone] (home)  Patient address:   [address] [place] [address],  Total Time spent with patient: 1.5 hours  Date of Admission:  11/05/2023 Date of Discharge: 11/12/2023  Reason for Admission: 32 yo Caucasian female admitted due to severe exacerbation of Major Depressive Disorder (MDD), with increasing suicidal ideation (SI) and new onset of passive homicidal ideation (HI) towards herself, her husband, and her dog, triggered by financial stress. She expressed detailed thoughts of self-harm and harming others, feeling overwhelmed and unsafe at home. The patient was unable to contract for safety, presenting an elevated risk of harm to herself and others. She endorsed symptoms of hopelessness, poor sleep, increased appetite, and stress-eating, with recent medication changes not fully alleviating her symptoms. Her condition met criteria for [facility] involuntary commitment due to her acute risk and need for psychiatric stabilization and close monitoring.  Principal Problem: MDD (major depressive disorder) Discharge Diagnoses: Principal Problem:   MDD (major depressive disorder)   Past Psychiatric History: ADD Anxiety  Past Medical History:  Past Medical History:  Diagnosis Date   ADD (attention deficit disorder)    Anemia    Asthma    Bicuspid aortic valve    Depression    GERD (gastroesophageal reflux disease)    Hypothyroidism    Migraine    Shoulder impingement syndrome, right     Past Surgical History:  Procedure Laterality Date   CYSTOSCOPY N/A 02/06/2023   Procedure: CYSTOSCOPY;  Surgeon: Conard Novak, MD;  Location: [facility] ORS;  Service: Gynecology;  Laterality: N/A;   ROBOTIC ASSISTED LAPAROSCOPIC HYSTERECTOMY AND SALPINGECTOMY Bilateral 02/06/2023   Procedure: XI ROBOTIC ASSISTED LAPAROSCOPIC HYSTERECTOMY AND SALPINGECTOMY;   Surgeon: Conard Novak, MD;  Location: [facility] ORS;  Service: Gynecology;  Laterality: Bilateral;   WISDOM TOOTH EXTRACTION     Family History:  Family History  Problem Relation Age of Onset   Heart disease Maternal Grandmother    Hypertension Maternal Grandmother    Stroke Maternal Grandmother    Diabetes Paternal Grandmother    Family Psychiatric  History: none noted Social History:  Social History   Substance and Sexual Activity  Alcohol Use Yes   Comment: occasionally     Social History   Substance and Sexual Activity  Drug Use No   Comment: cbd    Social History   Socioeconomic History   Marital status: Married    Spouse name: Not on file   Number of children: Not on file   Years of education: Not on file   Highest education level: Not on file  Occupational History   Not on file  Tobacco Use   Smoking status: Never   Smokeless tobacco: Never  Vaping Use   Vaping status: Never Used  Substance and Sexual Activity   Alcohol use: Yes    Comment: occasionally   Drug use: No    Comment: cbd   Sexual activity: Not on file  Other Topics Concern   Not on file  Social History Narrative   Not on file   Social Determinants of Health   Financial Resource Strain: Medium Risk (01/11/2023)   Received [facility], [facility]   Overall Financial Resource Strain (CARDIA)    Difficulty of Paying Living Expenses: Somewhat hard  Food Insecurity: Food Insecurity Present (11/05/2023)   Hunger Vital Sign  Worried About Programme researcher, broadcasting/film/video in the Last Year: Sometimes true    [employer] of Food in the Last Year: Never true  Transportation Needs: No Transportation Needs (11/05/2023)   PRAPARE - Administrator, Civil Service (Medical): No    Lack of Transportation (Non-Medical): No  Physical Activity: Insufficiently Active (01/11/2023)   [facility] from [facility], [facility]   Exercise  Vital Sign    Days of Exercise per Week: 3 days    Minutes of Exercise per Session: 30 min  Stress: Stress Concern Present (01/11/2023)   Received from [facility], [facility]   [employer] of Occupational Health - Occupational Stress Questionnaire    Feeling of Stress : To some extent  Social Connections: Moderately Isolated (01/11/2023)   Received from [facility], [facility]   Social Connection and Isolation Panel [NHANES]    Frequency of Communication with Friends and Family: Once a week    Frequency of Social Gatherings with Friends and Family: Once a week    Attends Religious Services: 1 to 4 times per year    Active Member of [employer] or Organizations: No    Attends Banker Meetings: Never    Marital Status: Married    Hospital Course: The patient  received daily assessments and medication management to stabilize her mental health. Upon admission, she presented with severe depressive symptoms, suicidal ideation (SI), passive homicidal ideation (HI), financial stress, and anxiety, with limited support at home and difficulty managing stress.Patient was found to be at high risk for self-harm and potential harm to others due to recent worsening of SI and HI. A comprehensive risk assessment confirmed her need for close monitoring. Initial treatment focused on providing a safe environment, monitoring her mood and behaviors, and adjusting her medications to improve symptom management.Risperdal was increased to 1 mg twice daily to manage intrusive and morbid thoughts, which contributed to her improvement over the course of her stay. She continued Lexapro 15 mg daily for depression and Buspar 7.5 mg twice daily for anxiety. Methylphenidate and Trazodone (50 mg at bedtime as needed) were maintained for ADHD and insomnia, respectively.These adjustments led to gradual improvement in mood and a decrease in  intrusive thoughts. She participated in daily therapy sessions and rounds, where she expressed feeling slightly better over time and more stable in her mood. She engaged cooperatively in discussions regarding her stressors, including financial issues and her recent job transition.Staff worked with her to develop a crisis and safety plan, providing support and coping techniques to manage her depressive symptoms and reduce risk of SI/HI. Patient education focused on the importance of medication adherence, stress management techniques, and the value of engaging in outpatient therapy upon discharge.By the end of her hospital stay, Shereported significant improvement in her mood with reduced SI and HI. She was encouraged to continue outpatient follow-up with her psychiatrist and to initiate therapy for continued support.      Musculoskeletal: Strength & Muscle Tone: within normal limits Gait & Station: normal Patient leans: N/A   Psychiatric Specialty Exam:  Presentation  General Appearance:  Appropriate for Environment  Eye Contact: Good  Speech: Clear and Coherent; Normal Rate  Speech Volume:No data recorded Handedness: Right   Mood and Affect  Mood: Euthymic  Affect: Appropriate; Congruent   Thought Process  Thought Processes: Coherent  Descriptions of Associations:Intact  Orientation:Full (Time, Place and Person) (and situation)  Thought Content:WDL  History of Schizophrenia/Schizoaffective disorder:No  Duration of Psychotic Symptoms:No data recorded Hallucinations:Hallucinations: None  Ideas of Reference:None  Suicidal Thoughts:Suicidal Thoughts: No  Homicidal Thoughts:Homicidal Thoughts: No   Sensorium  Memory: Immediate Good; Remote Good  Judgment: Good  Insight: Good   Executive Functions  Concentration: Good  Attention Span: Good  Recall: Good  Fund of Knowledge: Good  Language: Good   Psychomotor Activity  Psychomotor  Activity: Psychomotor Activity: Normal   Assets  Assets: Communication Skills; Desire for Improvement; Financial Resources/Insurance; Resilience   Sleep  Sleep: Sleep: Good Number of Hours of Sleep: 7    Physical Exam: Physical Exam Vitals and nursing note reviewed.  Constitutional:      Appearance: Normal appearance.  HENT:     Head: Normocephalic and atraumatic.     Nose: Nose normal.  Pulmonary:     Effort: Pulmonary effort is normal.  Musculoskeletal:        General: Normal range of motion.     Cervical back: Normal range of motion.  Neurological:     General: No focal deficit present.     Mental Status: She is alert and oriented to person, place, and time.  Psychiatric:        Attention and Perception: Attention and perception normal.        Mood and Affect: Mood and affect normal.        Speech: Speech normal.        Behavior: Behavior normal. Behavior is cooperative.        Thought Content: Thought content normal.        Cognition and Memory: Cognition and memory normal.        Judgment: Judgment normal.    ROS Blood pressure (!) 130/95, pulse (!) 102, temperature (!) 97.5 F (36.4 C), temperature source Oral, resp. rate 18, height 5\' 9"  (1.753 m), weight 119.7 kg, last menstrual period 04/06/2018, SpO2 100%. Body mass index is 38.96 kg/m.   Social History   Tobacco Use  Smoking Status Never  Smokeless Tobacco Never   Tobacco Cessation:  N/A, patient does not currently use tobacco products   Blood Alcohol level:  Lab Results  Component Value Date   ETH <10 11/04/2023    Metabolic Disorder Labs:  Lab Results  Component Value Date   HGBA1C 5.2 11/08/2023   MPG 102.54 11/08/2023   No results found for: "PROLACTIN" Lab Results  Component Value Date   CHOL 152 11/08/2023   TRIG 91 11/08/2023   HDL 42 11/08/2023   CHOLHDL 3.6 11/08/2023   VLDL 18 11/08/2023   LDLCALC 92 11/08/2023    See Psychiatric Specialty Exam and Suicide Risk  Assessment completed by Attending Physician prior to discharge.  Discharge destination:  Home  Is patient on multiple antipsychotic therapies at discharge:  No   Has Patient had three or more failed trials of antipsychotic monotherapy by history:  No  Recommended Plan for Multiple Antipsychotic Therapies: Taper to monotherapy as described:  Risperal   Allergies as of 11/12/2023   No Known Allergies      Medication List     STOP taking these medications    buPROPion 150 MG 24 hr tablet Commonly known as: WELLBUTRIN XL   busPIRone 7.5 MG tablet Commonly known as: BUSPAR   escitalopram 10 MG tablet Commonly known as: LEXAPRO   escitalopram 20 MG tablet Commonly known as: LEXAPRO   escitalopram 5 MG tablet Commonly known as: LEXAPRO   ibuprofen 600 MG  tablet Commonly known as: ADVIL   meclizine 25 MG tablet Commonly known as: ANTIVERT   Naltrex 4.5 MG Caps Generic drug: Naltrexone HCl (Pain)   omeprazole 40 MG capsule Commonly known as: PRILOSEC   rOPINIRole 0.5 MG tablet Commonly known as: REQUIP       TAKE these medications      Indication  albuterol 108 (90 Base) MCG/ACT inhaler Commonly known as: VENTOLIN HFA Inhale 2 puffs into the lungs every 4 (four) hours as needed for wheezing or shortness of breath.  Indication: Asthma   ascorbic acid 500 MG tablet Commonly known as: VITAMIN C Take 500 mg by mouth 2 (two) times daily.  Indication: Inadequate Vitamin C   cyanocobalamin 500 MCG tablet Commonly known as: VITAMIN B12 Take 1,000 mcg by mouth daily.  Indication: Pernicious Anemia   dexmethylphenidate 10 MG 24 hr capsule Commonly known as: FOCALIN XR Take 1 capsule (10 mg total) by mouth daily. Start taking on: November 13, 2023 What changed:  when to take this Another medication with the same name was removed. Continue taking this medication, and follow the directions you see here.  Indication: Attention Deficit Hyperactivity Disorder    estradiol 1 MG tablet Commonly known as: ESTRACE Take 1 mg by mouth daily.  Indication: Absence of Menstrual Periods in Woman who has had Them   FLUoxetine 20 MG capsule Commonly known as: PROZAC Take 1 capsule (20 mg total) by mouth daily. Start taking on: November 13, 2023  Indication: Depression   levothyroxine 50 MCG tablet Commonly known as: SYNTHROID Take 1 tablet (50 mcg total) by mouth daily at 6 (six) AM. Start taking on: November 13, 2023 What changed: when to take this  Indication: Underactive Thyroid   linaclotide 290 MCG Caps capsule Commonly known as: LINZESS Take 1 capsule (290 mcg total) by mouth every other day. Start taking on: November 14, 2023  Indication: Chronic Constipation of Unknown Cause   Lyrica 50 MG capsule Generic drug: pregabalin Take 50 mg by mouth 2 (two) times daily.  Indication: Generalized Anxiety Disorder, Neuropathic Pain   magnesium oxide 400 (240 Mg) MG tablet Commonly known as: MAG-OX Take 250 mg by mouth 2 (two) times daily.  Indication: Chronic Constipation of Unknown Cause   montelukast 10 MG tablet Commonly known as: SINGULAIR Take 10 mg by mouth at bedtime.  Indication: Asthma   ondansetron 4 MG disintegrating tablet Commonly known as: ZOFRAN-ODT Take 1 tablet (4 mg total) by mouth every 8 (eight) hours as needed for nausea or vomiting. What changed:  reasons to take this Another medication with the same name was removed. Continue taking this medication, and follow the directions you see here.  Indication: Nausea and Vomiting   pantoprazole 40 MG tablet Commonly known as: PROTONIX Take 1 tablet (40 mg total) by mouth daily. Start taking on: November 13, 2023  Indication: Gastroesophageal Reflux Disease   prenatal multivitamin Tabs tablet Take 1 tablet by mouth daily at 12 noon. Start taking on: November 13, 2023  Indication: Vitamin Deficiency   risperiDONE 1 MG tablet Commonly known as: RISPERDAL Take 1  tablet (1 mg total) by mouth 2 (two) times daily.  Indication: Manic Phase of Manic-Depression   saccharomyces boulardii 250 MG capsule Commonly known as: FLORASTOR Take 250 mg by mouth 2 (two) times daily.  Indication: digestion   traZODone 50 MG tablet Commonly known as: DESYREL Take 1 tablet (50 mg total) by mouth at bedtime as needed for sleep.  Indication: Trouble  Sleeping, Major Depressive Disorder   triamcinolone ointment 0.5 % Commonly known as: KENALOG Apply 1 Application topically. In both ear canals once nightly x 1 months, once weekly thereafter  Indication: Allergic Contact Dermatitis   VITAMIN D-3 PO Take 2,000 Units by mouth daily.  Indication: supplement        Follow-up Information     [facility].   Why: 11/20/23 11AM psychiatry telehealth appointment. Contact information: [address] [place] [address] [phone]         [facility], [facility]. Go to.   Why: Appointment is Monday, November 18th at [facility] information: [address] [place] [address] [phone]                 Follow-up recommendations:  Activity:  As tolerated Diet:  Heart Healthy  Comments:  Lexapro (escitalopram) - 15 mg once daily for depression and anxiety. Buspar (buspirone) - 7.5 mg twice daily for anxiety. Risperdal (risperidone) - 1 mg twice daily to manage intrusive thoughts and mood stability. Methylphenidate - as prescribed, for ADHD management. Trazodone - 50 mg at bedtime as needed for insomnia. Crisis Hotline Access: 988 Suicide & Crisis Lifeline available 24/7 for immediate support Purpose: Psychiatry Telehealth Appointment Date/Time: November 20, 2023, at 11:00 AM Location: [address], [place], [place] [address] Contact: [phone] [facility]: Behavioral Health Appointment Date/Time: November 18, 2023, at 10:00 AM Location: [address] [place], [place]  [address] Contact: [phone] Follow up with PCP in 1 week  Continue with previous medication as prescribed  Signed: Myriam Forehand, NP 11/12/2023, 3:01 PM

## 2023-11-12 NOTE — Group Note (Signed)
Date:  11/12/2023 Time:  11:19 AM  Group Topic/Focus:  Diagnosis Education:   The focus of this group is to discuss the major disorders that patients maybe diagnosed with.  Group discusses the importance of knowing what one's diagnosis is so that one can understand treatment and better advocate for oneself. Self Care:   The focus of this group is to help patients understand the importance of self-care in order to improve or restore emotional, physical, spiritual, interpersonal, and financial health.    Participation Level:  Active  Participation Quality:  Appropriate and Attentive  Affect:  Appropriate  Cognitive:  Alert, Appropriate, and Oriented  Insight: Appropriate  Engagement in Group:  Developing/Improving and Engaged  Modes of Intervention:  Activity, Discussion, and Education  Additional Comments:    Rosaura Carpenter 11/12/2023, 11:19 AM

## 2023-11-12 NOTE — Plan of Care (Signed)
Pt denies SI/HI/AVH, ready to D/C  Problem: Education: Goal: Knowledge of  General Education information/materials will improve Outcome: Progressing Goal: Emotional status will improve Outcome: Progressing Goal: Mental status will improve Outcome: Progressing Goal: Verbalization of understanding the information provided will improve Outcome: Progressing   Problem: Activity: Goal: Interest or engagement in activities will improve Outcome: Progressing Goal: Sleeping patterns will improve Outcome: Progressing   Problem: Coping: Goal: Ability to verbalize frustrations and anger appropriately will improve Outcome: Progressing Goal: Ability to demonstrate self-control will improve Outcome: Progressing   Problem: Health Behavior/Discharge Planning: Goal: Identification of resources available to assist in meeting health care needs will improve Outcome: Progressing Goal: Compliance with treatment plan for underlying cause of condition will improve Outcome: Progressing   Problem: Physical Regulation: Goal: Ability to maintain clinical measurements within normal limits will improve Outcome: Progressing   Problem: Safety: Goal: Periods of time without injury will increase Outcome: Progressing

## 2023-11-12 NOTE — Group Note (Signed)
[facility] LCSW Group Therapy Note    Group Date: 11/12/2023 Start Time: 1330 End Time: 1430  Type of Therapy and Topic:  Group Therapy:  Overcoming Obstacles  Participation Level:  [facility] PARTICIPATION LEVEL: Minimal  Mood:  Description of Group:   In this group patients will be encouraged to explore what they see as obstacles to their own wellness and recovery. They will be guided to discuss their thoughts, feelings, and behaviors related to these obstacles. The group will process together ways to cope with barriers, with attention given to specific choices patients can make. Each patient will be challenged to identify changes they are motivated to make in order to overcome their obstacles. This group will be process-oriented, with patients participating in exploration of their own experiences as well as giving and receiving support and challenge from other group members.  Therapeutic Goals: 1. Patient will identify personal and current obstacles as they relate to admission. 2. Patient will identify barriers that currently interfere with their wellness or overcoming obstacles.  3. Patient will identify feelings, thought process and behaviors related to these barriers. 4. Patient will identify two changes they are willing to make to overcome these obstacles:    Summary of Patient Progress Patient was present in group.  Patient shared that her biggest obstacle was "finances".  Patient was engaged and supportive of others.  Patient had to leave early for discharge planning with the nurse.   Therapeutic Modalities:   Cognitive Behavioral Therapy Solution Focused Therapy Motivational Interviewing Relapse Prevention Therapy   Harden Mo, LCSW
